# Patient Record
Sex: Male | Born: 1980 | Race: White | Hispanic: No | Marital: Married | Smoking: Current every day smoker
Health system: Southern US, Community
[De-identification: ages and names within clinical notes are randomized; demographics above are authoritative.]

## PROBLEM LIST (undated history)

## (undated) DIAGNOSIS — M928 Other specified juvenile osteochondrosis: Secondary | ICD-10-CM

## (undated) DIAGNOSIS — G8929 Other chronic pain: Secondary | ICD-10-CM

## (undated) DIAGNOSIS — M549 Dorsalgia, unspecified: Secondary | ICD-10-CM

## (undated) HISTORY — DX: Other chronic pain: G89.29

## (undated) HISTORY — DX: Dorsalgia, unspecified: M54.9

## (undated) HISTORY — DX: Other specified juvenile osteochondrosis: M92.8

---

## 2001-03-10 DIAGNOSIS — G8929 Other chronic pain: Secondary | ICD-10-CM

## 2001-03-10 HISTORY — DX: Other chronic pain: G89.29

## 2009-11-07 ENCOUNTER — Other Ambulatory Visit: Payer: Self-pay | Admitting: Internal Medicine

## 2009-12-07 ENCOUNTER — Other Ambulatory Visit: Payer: Self-pay | Admitting: Internal Medicine

## 2010-11-08 ENCOUNTER — Other Ambulatory Visit: Payer: Self-pay | Admitting: Internal Medicine

## 2010-11-08 DIAGNOSIS — M545 Low back pain: Secondary | ICD-10-CM

## 2010-11-08 NOTE — Telephone Encounter (Signed)
Patient needs a refill of Tramadol 50 mg 1 tablet by mouth 5 times a day,  sent to [facility]. He needs to pick up Rx today, he is going out of town for a week leaving 11/09/10

## 2010-11-09 MED ORDER — TRAMADOL HCL 50 MG PO TABS: 50.0000 mg | ORAL_TABLET | Freq: Every day | ORAL | Status: DC | PRN

## 2010-12-04 NOTE — Telephone Encounter (Signed)
Refill was complete 11/08/10

## 2010-12-30 ENCOUNTER — Other Ambulatory Visit: Payer: Self-pay | Admitting: Internal Medicine

## 2011-01-10 ENCOUNTER — Ambulatory Visit: Payer: Self-pay | Admitting: Internal Medicine

## 2011-01-10 DIAGNOSIS — Z0289 Encounter for other administrative examinations: Secondary | ICD-10-CM

## 2011-05-16 ENCOUNTER — Ambulatory Visit (INDEPENDENT_AMBULATORY_CARE_PROVIDER_SITE_OTHER): Payer: Self-pay | Admitting: Internal Medicine

## 2011-05-16 ENCOUNTER — Encounter: Payer: Self-pay | Admitting: Internal Medicine

## 2011-05-16 DIAGNOSIS — Z720 Tobacco use: Secondary | ICD-10-CM | POA: Insufficient documentation

## 2011-05-16 DIAGNOSIS — M549 Dorsalgia, unspecified: Secondary | ICD-10-CM | POA: Insufficient documentation

## 2011-05-16 DIAGNOSIS — F17200 Nicotine dependence, unspecified, uncomplicated: Secondary | ICD-10-CM

## 2011-05-16 DIAGNOSIS — J329 Chronic sinusitis, unspecified: Secondary | ICD-10-CM | POA: Insufficient documentation

## 2011-05-16 MED ORDER — METAXALONE 800 MG PO TABS: 800.0000 mg | ORAL_TABLET | Freq: Three times a day (TID) | ORAL | Status: DC

## 2011-05-16 MED ORDER — TRAMADOL HCL 50 MG PO TABS: 100.0000 mg | ORAL_TABLET | Freq: Four times a day (QID) | ORAL | Status: DC | PRN

## 2011-05-16 MED ORDER — VARENICLINE TARTRATE 0.5 MG PO TABS: 0.5000 mg | ORAL_TABLET | Freq: Two times a day (BID) | ORAL | Status: DC

## 2011-05-16 MED ORDER — AMOXICILLIN 500 MG PO CAPS: 500.0000 mg | ORAL_CAPSULE | Freq: Three times a day (TID) | ORAL | Status: AC

## 2011-05-16 NOTE — Patient Instructions (Signed)
For your sinus infection,  Take amoxicillin 3 times daily and sudafed PE (oral decongestant) 10 meg every 6 hours for congestion   Also consider using Simply Saline twice daily on each side  For the rest of the winter/spring to prevent more sinus infections.   Start your chantix one week before you quit smoking

## 2011-05-16 NOTE — Progress Notes (Signed)
Subjective:    Patient ID: Corey Shaw, male    DOB: Feb 14, 1981, 31 y.o.   MRN: [id number]  .  HPI  Mr. Corey Shaw is a 31 yr old white male with a history of chronic low back pain , managed with tramadol who presents for followup.  He was last seen by Dr Alison Murray.,  Back pain is tolerable during the day with tramadol but worse in the morning and evening . He is also having bilateral knee pain lately and has a history of Osgood Schlatter Syndrome.  His pain is worse when he takes the weight off after standing.  2nd issue is sinus infection,  Sinus pain and congestion for the past two weeks. 3rd issue is chronic tobacco abuse, up to one pack daily for the last 10 years.    , Past Medical History  Diagnosis Date  . Osgood-Schlatter/osteochondroses    No current outpatient prescriptions on file prior to visit.   Review of Systems  Constitutional: Negative for fever, chills, diaphoresis, activity change, appetite change, fatigue and unexpected weight change.  HENT: Negative for hearing loss, ear pain, nosebleeds, congestion, sore throat, facial swelling, rhinorrhea, sneezing, drooling, mouth sores, trouble swallowing, neck pain, neck stiffness, dental problem, voice change, postnasal drip, sinus pressure, tinnitus and ear discharge.   Eyes: Negative for photophobia, pain, discharge, redness, itching and visual disturbance.  Respiratory: Negative for apnea, cough, choking, chest tightness, shortness of breath, wheezing and stridor.   Cardiovascular: Negative for chest pain, palpitations and leg swelling.  Gastrointestinal: Negative for nausea, vomiting, abdominal pain, diarrhea, constipation, blood in stool, abdominal distention, anal bleeding and rectal pain.  Genitourinary: Negative for dysuria, urgency, frequency, hematuria, flank pain, decreased urine volume, scrotal swelling, difficulty urinating and testicular pain.  Musculoskeletal: Negative for myalgias, back pain, joint swelling,  arthralgias and gait problem.  Skin: Negative for color change, rash and wound.  Neurological: Negative for dizziness, tremors, seizures, syncope, speech difficulty, weakness, light-headedness, numbness and headaches.  Psychiatric/Behavioral: Negative for suicidal ideas, hallucinations, behavioral problems, confusion, sleep disturbance, dysphoric mood, decreased concentration and agitation. The patient is not nervous/anxious.   T      Objective:   Physical Exam General appearance: alert, cooperative and appears stated age Ears: normal TM's and external ear canals both ears Throat: lips, mucosa, and tongue normal; teeth and gums normal Neck: no adenopathy, no carotid bruit, supple, symmetrical, trachea midline and thyroid not enlarged, symmetric, no tenderness/mass/nodules Back: symmetric, no curvature. ROM normal. No CVA tenderness. Lungs: clear to auscultation bilaterally Heart: regular rate and rhythm, S1, S2 normal, no murmur, click, rub or gallop Abdomen: soft, non-tender; bowel sounds normal; no masses,  no organomegaly Pulses: 2+ and symmetric Skin: Skin color, texture, turgor normal. No rashes or lesions Lymph nodes: Cervical, supraclavicular, and axillary nodes normal.     Assessment & Plan:   Back pain Chronic, persistent, with some radiation but not below the knee. dtrs normal.  Managed with tramadol.  MRi  not done bc patient remains uninsured. Will increase his tramadol to 6 daily. Adding skelaxin  Sinusitis Given chronicity of symptoms, development of facial pain and exam consistent with bacterial URI,  Will treat with empiric antibiotics, decongestants, and saline lavage.  Adding steroid nasal spray if not already taking.   Tobacco abuse counselling given , He is interested in starting Chantix to help him quit smoking. Coupon given,  MOA of Chantix has been discussed .    Updated Medication List Outpatient Encounter Prescriptions as of 05/16/2011  Medication Sig  Dispense Refill  . meloxicam (MOBIC) 15 MG tablet Take 15 mg by mouth daily.      . traMADol (ULTRAM) 50 MG tablet Take 2 tablets (100 mg total) by mouth every 6 (six) hours as needed for pain.  180 tablet  6  . DISCONTD: cyclobenzaprine (FLEXERIL) 10 MG tablet Take 10 mg by mouth 3 (three) times daily as needed.      Marland Kitchen DISCONTD: traMADol (ULTRAM) 50 MG tablet TAKE ONE TABLET BY MOUTH 4 TIMES DAILY AS NEEDED FOR PAIN  120 tablet  2  . amoxicillin (AMOXIL) 500 MG capsule Take 1 capsule (500 mg total) by mouth 3 (three) times daily.  21 capsule  0  . metaxalone (SKELAXIN) 800 MG tablet Take 1 tablet (800 mg total) by mouth 3 (three) times daily.  90 tablet  6  . varenicline (CHANTIX) 0.5 MG tablet Take 1 tablet (0.5 mg total) by mouth 2 (two) times daily.  60 tablet  0

## 2011-05-18 ENCOUNTER — Encounter: Payer: Self-pay | Admitting: Internal Medicine

## 2011-05-18 DIAGNOSIS — M928 Other specified juvenile osteochondrosis: Secondary | ICD-10-CM | POA: Insufficient documentation

## 2011-05-18 NOTE — Assessment & Plan Note (Addendum)
Chronic, persistent, with some radiation but not below the knee. dtrs normal.  Managed with tramadol.  MRi  not done bc patient remains uninsured. Will increase his tramadol to 6 daily. Adding skelaxin

## 2011-05-18 NOTE — Assessment & Plan Note (Signed)
Given chronicity of symptoms, development of facial pain and exam consistent with bacterial URI,  Will treat with empiric antibiotics, decongestants, and saline lavage.  Adding steroid nasal spray if not already taking.  °

## 2011-05-18 NOTE — Assessment & Plan Note (Signed)
counselling given , He is interested in starting Chantix to help him quit smoking. Coupon given,  MOA of Chantix has been discussed .

## 2011-07-25 ENCOUNTER — Other Ambulatory Visit: Payer: Self-pay | Admitting: Internal Medicine

## 2011-07-25 ENCOUNTER — Ambulatory Visit (INDEPENDENT_AMBULATORY_CARE_PROVIDER_SITE_OTHER)
Admission: RE | Admit: 2011-07-25 | Discharge: 2011-07-25 | Disposition: A | Discharge status: Home / Self Care | Payer: Self-pay | Source: Ambulatory Visit | Attending: Internal Medicine | Admitting: Internal Medicine

## 2011-07-25 ENCOUNTER — Telehealth: Payer: Self-pay | Admitting: Internal Medicine

## 2011-07-25 DIAGNOSIS — M7989 Other specified soft tissue disorders: Secondary | ICD-10-CM

## 2011-07-25 MED ORDER — HYDROCODONE-ACETAMINOPHEN 5-500 MG PO TABS: 1.0000 | ORAL_TABLET | Freq: Four times a day (QID) | ORAL | Status: DC | PRN

## 2011-07-25 NOTE — Telephone Encounter (Signed)
Caller: Neilson/Patient; PCP: Duncan Dull; CB#: [phone]; ; ; Call regarding Injury/Trauma; smashed finger between two stepping stones; injured R ring finger.  Occurred 07/24/11 1230.  Pain seemed to be minimal, but increased over course of PM 07/24/11.  On arising AM 07/25/11 notes that the finger is now very swollen from the proximal knuckle to the tip of the finger.  States it is extremely painful to touch or bend the finger.  Per protocol, advised appt within 4 hours; no appts available in system.  TC to office per office protocol; RN transferred to office clinical staff voicemail; message left regarding patient for call back.

## 2011-07-25 NOTE — Telephone Encounter (Signed)
I spoke with patient he will go to [facility] this afternoon for the x ray.

## 2011-07-25 NOTE — Telephone Encounter (Signed)
Patient needs an x ray.  If he is willing to go to [facility] this afternoon for plain films of right ring finger,  I will put in EPIC.  He needs to go soon so in case there is a fracture i can contact an orthopedists office to see if they can splint him

## 2011-07-30 ENCOUNTER — Encounter: Payer: Self-pay | Admitting: Internal Medicine

## 2011-07-30 ENCOUNTER — Ambulatory Visit (INDEPENDENT_AMBULATORY_CARE_PROVIDER_SITE_OTHER): Payer: Self-pay | Admitting: Internal Medicine

## 2011-07-30 VITALS — BP 130/68 | HR 76 | Temp 98.4°F | Resp 14 | Wt 120.5 lb

## 2011-07-30 DIAGNOSIS — F17200 Nicotine dependence, unspecified, uncomplicated: Secondary | ICD-10-CM

## 2011-07-30 DIAGNOSIS — Z720 Tobacco use: Secondary | ICD-10-CM

## 2011-07-30 DIAGNOSIS — S62609A Fracture of unspecified phalanx of unspecified finger, initial encounter for closed fracture: Secondary | ICD-10-CM

## 2011-07-30 DIAGNOSIS — G8929 Other chronic pain: Secondary | ICD-10-CM

## 2011-07-30 DIAGNOSIS — E119 Type 2 diabetes mellitus without complications: Secondary | ICD-10-CM

## 2011-07-30 DIAGNOSIS — M549 Dorsalgia, unspecified: Secondary | ICD-10-CM

## 2011-07-30 MED ORDER — CYCLOBENZAPRINE HCL 10 MG PO TABS: 10.0000 mg | ORAL_TABLET | Freq: Three times a day (TID) | ORAL | Status: DC | PRN
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
## 2011-08-03 ENCOUNTER — Encounter: Payer: Self-pay | Admitting: Internal Medicine

## 2011-08-03 DIAGNOSIS — R7301 Impaired fasting glucose: Secondary | ICD-10-CM | POA: Insufficient documentation

## 2011-08-03 DIAGNOSIS — S62609A Fracture of unspecified phalanx of unspecified finger, initial encounter for closed fracture: Secondary | ICD-10-CM | POA: Insufficient documentation

## 2011-08-03 DIAGNOSIS — M5431 Sciatica, right side: Secondary | ICD-10-CM | POA: Insufficient documentation

## 2011-08-03 NOTE — Assessment & Plan Note (Signed)
Prior trial of Chantix was not tolerated due to abdominal pain..  He is not currently motivated to quit.    

## 2011-08-03 NOTE — Assessment & Plan Note (Signed)
By hgba1c in 2011 of 6.2, with no follow up labs since then.  Will have him return in 6 weeks for recheck on his finger, with labs prior to appt.

## 2011-08-03 NOTE — Assessment & Plan Note (Signed)
Secondary to crush injury while moving cement pavers.  He has been wearing a finger splint since the accident and will need to wear it for a minimum of 4 to 6 weeks,  Ice , NSAID, tramadol/vicodin.
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
## 2011-08-13 ENCOUNTER — Encounter: Payer: Self-pay | Admitting: Internal Medicine

## 2011-08-21 ENCOUNTER — Other Ambulatory Visit: Payer: Self-pay | Admitting: Internal Medicine

## 2011-08-22 MED ORDER — HYDROCODONE-ACETAMINOPHEN 5-500 MG PO TABS: 1.0000 | ORAL_TABLET | Freq: Four times a day (QID) | ORAL | Status: AC | PRN

## 2011-08-22 NOTE — Progress Notes (Signed)
One refillonly,  Because this was for his broken finger

## 2011-11-17 ENCOUNTER — Other Ambulatory Visit: Payer: Self-pay | Admitting: Internal Medicine

## 2011-11-17 ENCOUNTER — Ambulatory Visit: Payer: Self-pay | Admitting: Internal Medicine

## 2011-11-17 MED ORDER — CYCLOBENZAPRINE HCL 10 MG PO TABS: 10.0000 mg | ORAL_TABLET | Freq: Three times a day (TID) | ORAL | Status: AC | PRN

## 2011-11-17 MED ORDER — TRAMADOL HCL 50 MG PO TABS: 100.0000 mg | ORAL_TABLET | Freq: Four times a day (QID) | ORAL | Status: DC | PRN

## 2011-11-17 NOTE — Telephone Encounter (Signed)
Tramadol and flexirel, he is running out today on these two medications. He is rescheduled to Thursday of this week.  He uses [facility] on [address].

## 2011-11-20 ENCOUNTER — Ambulatory Visit: Payer: Self-pay | Admitting: Internal Medicine

## 2011-12-02 ENCOUNTER — Ambulatory Visit (INDEPENDENT_AMBULATORY_CARE_PROVIDER_SITE_OTHER): Payer: Self-pay | Admitting: Internal Medicine

## 2011-12-02 ENCOUNTER — Encounter: Payer: Self-pay | Admitting: Internal Medicine

## 2011-12-02 VITALS — BP 130/70 | HR 97 | Temp 98.1°F | Resp 16 | Wt 123.2 lb

## 2011-12-02 DIAGNOSIS — G4700 Insomnia, unspecified: Secondary | ICD-10-CM | POA: Insufficient documentation

## 2011-12-02 DIAGNOSIS — E119 Type 2 diabetes mellitus without complications: Secondary | ICD-10-CM

## 2011-12-02 DIAGNOSIS — S62609A Fracture of unspecified phalanx of unspecified finger, initial encounter for closed fracture: Secondary | ICD-10-CM

## 2011-12-02 NOTE — Assessment & Plan Note (Signed)
Needs fasting glucose and hgba1c.  6.2 in 2011.  No follow up since then

## 2011-12-02 NOTE — Assessment & Plan Note (Addendum)
Secondary to loss of grandfather suddenly due to [facility] MI one week ago.,  Samples of Lunesta 2 mg and 3 mg given

## 2011-12-02 NOTE — Assessment & Plan Note (Signed)
Healed,  Still has sensitivity to pressure, but all swelling and discoloration is gone

## 2011-12-02 NOTE — Progress Notes (Signed)
Patient ID: Corey Shaw, male   DOB: 01-Mar-1981, 31 y.o.   MRN: [id number] Patient Active Problem List  Diagnosis  . Tobacco abuse  . Back pain  . Sinusitis  . Osgood-Schlatter/osteochondroses  . Chronic back pain greater than 3 months duration  . Fx phalanges, hand-closed  . Diabetes mellitus type 2, diet-controlled  . Insomnia    Subjective:  CC:   Chief Complaint  Patient presents with  . Follow-up    6 month    HPI:   Corey Shaw is a 31 y.o. male who presents for follow up on chronic conditions, including  increased anxiety lately.  His grandfather died a week ago suddenly of a massive MI at the age of  30 yrs old after complaining of chest pain for 3 days.  He has been having insomnia. With trouble falling asleep every night.  His chronic back pain is controlled with tramadol.  His right third finger distal nondisplaced fracture swelling has resolved, but the tip is still sensitive to pressure.    Past Medical History  Diagnosis Date  . Osgood-Schlatter/osteochondroses   . Chronic back pain greater than 3 months duration 2003    post injury. last MRI 2008: mild disk bulge L5-S1 no stenosis  . Diabetes mellitus     History reviewed. No pertinent past surgical history.   The following portions of the patient's history were reviewed and updated as appropriate: Allergies, current medications, and problem list.    Review of Systems:   12 Pt  review of systems was negative except those addressed in the HPI.    History   Social History  . Marital Status: Married    Spouse Name: N/A    Number of Children: N/A  . Years of Education: N/A   Occupational History  . Not on file.   Social History Main Topics  . Smoking status: Current Every Day Smoker    Types: Cigarettes  . Smokeless tobacco: Never Used  . Alcohol Use: No  . Drug Use: Not on file  . Sexually Active: Not on file   Other Topics Concern  . Not on file   Social History Narrative  . No  narrative on file    Objective:  BP 130/70  Pulse 97  Temp 98.1 F (36.7 C) (Oral)  Resp 16  Wt 123 lb 4 oz (55.906 kg)  SpO2 99%  General appearance: alert, cooperative and appears stated age Back: symmetric, no curvature. ROM normal. No CVA tenderness. Lungs: clear to auscultation bilaterally Heart: regular rate and rhythm, S1, S2 normal, no murmur, click, rub or gallop Abdomen: soft, non-tender; bowel sounds normal; no masses,  no organomegaly Pulses: 2+ and symmetric Skin: Skin color, texture, turgor normal. No rashes or lesions Lymph nodes: Cervical, supraclavicular, and axillary nodes normal.  Assessment and Plan:  Diabetes mellitus type 2, diet-controlled Needs fasting glucose and hgba1c.  6.2 in 2011.  No follow up since then   Fx phalanges, hand-closed Healed,  Still has sensitivity to pressure, but all swelling and discoloration is gone   Insomnia Secondary to loss of grandfather suddenly due to [facility] MI one week ago.,  Samples of Lunesta 2 mg and 3 mg given    Updated Medication List Outpatient Encounter Prescriptions as of 12/02/2011  Medication Sig Dispense Refill  . cyclobenzaprine (FLEXERIL) 10 MG tablet Take 1 tablet (10 mg total) by mouth 3 (three) times daily as needed for muscle spasms.  90 tablet  3  .  traMADol (ULTRAM) 50 MG tablet Take 2 tablets (100 mg total) by mouth every 6 (six) hours as needed for pain.  180 tablet  6     No orders of the defined types were placed in this encounter.    No Follow-up on file.

## 2011-12-05 ENCOUNTER — Telehealth: Payer: Self-pay | Admitting: Internal Medicine

## 2011-12-05 DIAGNOSIS — G4700 Insomnia, unspecified: Secondary | ICD-10-CM

## 2011-12-05 NOTE — Telephone Encounter (Signed)
Patient called and stated you gave him samples of a sleep med but told him to call back if he thought it was more anxiety related.  He stated he thinks he does have anxiety and wanted to know if you could prescribe an Rx for that.  Please advise.

## 2011-12-08 MED ORDER — ALPRAZOLAM 0.5 MG PO TABS: 0.5000 mg | ORAL_TABLET | Freq: Every evening | ORAL | Status: DC | PRN

## 2011-12-08 NOTE — Telephone Encounter (Signed)
Yes , we did discuss a trial of  Alprazolam 0.5 mg one tablet daily at bedtime as needed for sleep or anxiety  #30 no refill.

## 2011-12-09 NOTE — Telephone Encounter (Signed)
Pt notified and RX faxed to pharmacy.

## 2012-01-08 ENCOUNTER — Other Ambulatory Visit: Payer: Self-pay | Admitting: Internal Medicine

## 2012-01-08 NOTE — Telephone Encounter (Signed)
#  30 with one refill

## 2012-01-09 ENCOUNTER — Other Ambulatory Visit: Payer: Self-pay

## 2012-01-09 NOTE — Telephone Encounter (Signed)
Rx Xanax 0.5 mg #30 1 R faxed to [facility] [phone]

## 2012-05-03 ENCOUNTER — Other Ambulatory Visit: Payer: Self-pay | Admitting: Internal Medicine

## 2012-05-04 NOTE — Telephone Encounter (Signed)
Ok to refill,  Authorized in epic 

## 2012-05-05 NOTE — Telephone Encounter (Signed)
Rx called to pharmacy

## 2012-07-12 ENCOUNTER — Other Ambulatory Visit: Payer: Self-pay | Admitting: Internal Medicine

## 2012-07-12 NOTE — Telephone Encounter (Signed)
30 days only,  Needs appt prior to more refills and labs (CMET A1c and fasting lipids

## 2012-07-13 NOTE — Telephone Encounter (Signed)
Pt notified of need of appointment and labs prior to next refill. Declined to schedule at this time, will call back.

## 2012-08-24 ENCOUNTER — Ambulatory Visit (INDEPENDENT_AMBULATORY_CARE_PROVIDER_SITE_OTHER): Payer: Self-pay | Admitting: Internal Medicine

## 2012-08-24 ENCOUNTER — Encounter: Payer: Self-pay | Admitting: Internal Medicine

## 2012-08-24 VITALS — BP 126/74 | HR 111 | Temp 98.3°F | Resp 16 | Wt 121.5 lb

## 2012-08-24 DIAGNOSIS — F411 Generalized anxiety disorder: Secondary | ICD-10-CM | POA: Insufficient documentation

## 2012-08-24 DIAGNOSIS — G8929 Other chronic pain: Secondary | ICD-10-CM

## 2012-08-24 DIAGNOSIS — M549 Dorsalgia, unspecified: Secondary | ICD-10-CM

## 2012-08-24 MED ORDER — TRAMADOL HCL 50 MG PO TABS: ORAL_TABLET | ORAL | Status: DC

## 2012-08-24 MED ORDER — SERTRALINE HCL 50 MG PO TABS: 50.0000 mg | ORAL_TABLET | Freq: Every day | ORAL | Status: DC

## 2012-08-24 MED ORDER — ALPRAZOLAM 1 MG PO TABS: ORAL_TABLET | ORAL | Status: DC

## 2012-08-24 MED ORDER — HYDROCODONE-ACETAMINOPHEN 5-325 MG PO TABS: 1.0000 | ORAL_TABLET | Freq: Every evening | ORAL | Status: DC | PRN

## 2012-08-24 NOTE — Patient Instructions (Addendum)
I have increased your alprazolam to 1 mg twice daily AS NEEDED and recommend that you start sertraline (generic zoloft) on a daily basis to help you mange the ups and downs of yoru currently turbulent home life  vicodin for nighttime pain only..  You must get  Your labs done prior to refills on the vicodin
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
## 2012-08-24 NOTE — Assessment & Plan Note (Addendum)
Sertraline and alprazolam recommended.

## 2012-08-25 ENCOUNTER — Encounter: Payer: Self-pay | Admitting: Internal Medicine

## 2012-08-25 NOTE — Assessment & Plan Note (Signed)
Continue tramadol for daytime pain and adding one vicodin nightly to help him rest.  UDS and labs ordered.

## 2012-11-22 ENCOUNTER — Telehealth: Payer: Self-pay | Admitting: Internal Medicine

## 2012-11-22 MED ORDER — TRAMADOL HCL 50 MG PO TABS: ORAL_TABLET | ORAL | Status: DC

## 2012-11-22 NOTE — Telephone Encounter (Signed)
Since new law pharmacy will not refill without new script, okay to fill? Do you want patient to see Chanel before filling?

## 2012-11-22 NOTE — Telephone Encounter (Signed)
Notified patient script sent, patient has an appointment for 08/2013 because of insurance purposes he ask if that would be soon enough ( has no insurance) please advise.

## 2012-11-22 NOTE — Telephone Encounter (Signed)
Can refill,   Printed ,  Needs appt soon

## 2012-11-22 NOTE — Telephone Encounter (Signed)
Hell have to have the urine test like everyone else prior to any more refills,  But he does not need to see me unless he is requesting a dose change.

## 2012-11-22 NOTE — Telephone Encounter (Signed)
The patient stated that he has 3 refills ,but the pharmacy will not refill his prescription.  traMADol (ULTRAM) 50 MG tablet

## 2012-11-25 NOTE — Telephone Encounter (Signed)
Patient notified needs to come in for UDS before further refills.

## 2013-01-04 ENCOUNTER — Telehealth: Payer: Self-pay | Admitting: Internal Medicine

## 2013-01-04 NOTE — Telephone Encounter (Signed)
Tramadol and Norco refills needed.  States he still has xanax but would like to go ahead and get this one too since he is having to go through picking up scripts.

## 2013-01-04 NOTE — Telephone Encounter (Signed)
Pt states he now has insurance and would like to talk with Dr. Darrick Huntsman about any surgery or anything that can be done to help him.  Pt is making appt to discuss.  States he needs medication asap as he is out and is having a lot of pain.

## 2013-01-05 ENCOUNTER — Encounter: Payer: Self-pay | Admitting: *Deleted

## 2013-01-05 MED ORDER — TRAMADOL HCL 50 MG PO TABS: ORAL_TABLET | ORAL | Status: DC

## 2013-01-05 MED ORDER — ALPRAZOLAM 1 MG PO TABS: ORAL_TABLET | ORAL | Status: DC

## 2013-01-05 MED ORDER — HYDROCODONE-ACETAMINOPHEN 5-325 MG PO TABS: 1.0000 | ORAL_TABLET | Freq: Every evening | ORAL | Status: DC | PRN

## 2013-01-05 NOTE — Telephone Encounter (Signed)
Patient notified script ready for pick up. Needs UDS 

## 2013-01-05 NOTE — Telephone Encounter (Signed)
Ok to refill,  Authorized in epic 

## 2013-01-12 ENCOUNTER — Ambulatory Visit (INDEPENDENT_AMBULATORY_CARE_PROVIDER_SITE_OTHER): Payer: Medicaid Other | Admitting: Internal Medicine

## 2013-01-12 ENCOUNTER — Encounter (INDEPENDENT_AMBULATORY_CARE_PROVIDER_SITE_OTHER): Payer: Self-pay

## 2013-01-12 ENCOUNTER — Encounter: Payer: Self-pay | Admitting: Internal Medicine

## 2013-01-12 VITALS — BP 110/66 | HR 94 | Temp 98.0°F | Resp 12 | Ht 68.0 in | Wt 124.5 lb

## 2013-01-12 DIAGNOSIS — Z13220 Encounter for screening for lipoid disorders: Secondary | ICD-10-CM

## 2013-01-12 DIAGNOSIS — E559 Vitamin D deficiency, unspecified: Secondary | ICD-10-CM

## 2013-01-12 DIAGNOSIS — F411 Generalized anxiety disorder: Secondary | ICD-10-CM

## 2013-01-12 DIAGNOSIS — M25569 Pain in unspecified knee: Secondary | ICD-10-CM

## 2013-01-12 DIAGNOSIS — G8929 Other chronic pain: Secondary | ICD-10-CM

## 2013-01-12 DIAGNOSIS — M25562 Pain in left knee: Secondary | ICD-10-CM

## 2013-01-12 DIAGNOSIS — IMO0002 Reserved for concepts with insufficient information to code with codable children: Secondary | ICD-10-CM

## 2013-01-12 DIAGNOSIS — R5381 Other malaise: Secondary | ICD-10-CM

## 2013-01-12 DIAGNOSIS — M549 Dorsalgia, unspecified: Secondary | ICD-10-CM

## 2013-01-12 DIAGNOSIS — E119 Type 2 diabetes mellitus without complications: Secondary | ICD-10-CM

## 2013-01-12 MED ORDER — PREGABALIN 75 MG PO CAPS: 75.0000 mg | ORAL_CAPSULE | Freq: Two times a day (BID) | ORAL | Status: DC

## 2013-01-12 MED ORDER — SERTRALINE HCL 100 MG PO TABS: 100.0000 mg | ORAL_TABLET | Freq: Every day | ORAL | Status: DC

## 2013-01-12 MED ORDER — ALPRAZOLAM 1 MG PO TABS: ORAL_TABLET | ORAL | Status: DC

## 2013-01-12 NOTE — Patient Instructions (Signed)
Resume zoloft for anxiety at 1/2 tablet daily for the first week,  Then full tablet.  Lyrica 75 mg up to 3 times daily  We will try Chantix once we get your anxiety under control

## 2013-01-12 NOTE — Progress Notes (Signed)
Pre-visit discussion using our clinic review tool. No additional management support is needed unless otherwise documented below in the visit note.  

## 2013-01-12 NOTE — Progress Notes (Signed)
Patient ID: Corey Shaw, male   DOB: December 29, 1980, 32 y.o.   MRN: [id number]  Patient Active Problem List   Diagnosis Date Noted  . Anxiety state, unspecified 08/24/2012  . Insomnia 12/02/2011  . Fx phalanges, hand-closed 08/03/2011  . Chronic back pain greater than 3 months duration   . Diabetes mellitus type 2, diet-controlled   . Osgood-Schlatter/osteochondroses   . Tobacco abuse 05/16/2011    Subjective:  CC:   Chief Complaint  Patient presents with  . Back Pain  . Leg Pain    bilateral leg pain    HPI:   Corey Shaw a 32 y.o. male who presents for follow upon chronic issues.   Marital discord has now resolved.  Wife is now going to school to finish her Bachelors Degree  Having oral surgery next week.,   extractions   Eye exam also planned  Wants to get his lumbar spine MRI'd bc he has been unable to afford this in the past while insured.   He recently had an episode of left leg numbness and weakness while descending a ladder ,  [place] Seat to the ground. He has 3 kids to support and is worried about his back.  His back pain radiates down both legs.  Both legs and knees ache. Used to take lyrica   Does not want flu shot     Past Medical History  Diagnosis Date  . Osgood-Schlatter/osteochondroses   . Chronic back pain greater than 3 months duration 2003    post injury. last MRI 2008: mild disk bulge L5-S1 no stenosis  . Diabetes mellitus     History reviewed. No pertinent past surgical history.     The following portions of the patient's history were reviewed and updated as appropriate: Allergies, current medications, and problem list.    Review of Systems:   12 Pt  review of systems was negative except those addressed in the HPI,     History   Social History  . Marital Status: Married    Spouse Name: N/A    Number of Children: N/A  . Years of Education: N/A   Occupational History  . Not on file.   Social History Main Topics  . Smoking  status: Current Every Day Smoker    Types: Cigarettes  . Smokeless tobacco: Never Used  . Alcohol Use: No  . Drug Use: Not on file  . Sexual Activity: Not on file   Other Topics Concern  . Not on file   Social History Narrative  . No narrative on file    Objective:  Filed Vitals:   01/12/13 1014  BP: 110/66  Pulse: 94  Temp: 98 F (36.7 C)  Resp: 12     General appearance: alert, cooperative and appears stated age Ears: normal TM's and external ear canals both ears Throat: lips, mucosa, and tongue normal; teeth and gums normal Neck: no adenopathy, no carotid bruit, supple, symmetrical, trachea midline and thyroid not enlarged, symmetric, no tenderness/mass/nodules Back: symmetric, no curvature. ROM normal. No CVA tenderness. Lungs: clear to auscultation bilaterally Heart: regular rate and rhythm, S1, S2 normal, no murmur, click, rub or gallop Abdomen: soft, non-tender; bowel sounds normal; no masses,  no organomegaly Pulses: 2+ and symmetric Skin: Skin color, texture, turgor normal. No rashes or lesions Lymph nodes: Cervical, supraclavicular, and axillary nodes normal. Foot exam:  Nails are well trimmed,  No callouses,  Sensation intact to microfilament   Assessment and Plan:  Chronic back pain greater  than 3 months duration With symptoms suggestive of spinal stenosis .  MRI of lumbar spine ordered  Diabetes mellitus type 2, diet-controlled Historically Well-controlled on diet alone .  hemoglobin A1c has been consistently less than 7.0 . Patient is soon to be up-to-date on eye exams and foot exam was done today.  .  Fasting lipids have been ordered.   Updated Medication List Outpatient Encounter Prescriptions as of 01/12/2013  Medication Sig  . ALPRAZolam (XANAX) 1 MG tablet TAKE ONE TABLET BY MOUTH AT BEDTIME AS NEEDED FOR SLEEP OR ANXIETY  . HYDROcodone-acetaminophen (NORCO/VICODIN) 5-325 MG per tablet Take 1 tablet by mouth at bedtime as needed for pain.  .  traMADol (ULTRAM) 50 MG tablet TAKE TWO TABLETS BY MOUTH EVERY 6 HOURS AS NEEDED FOR PAIN  . [DISCONTINUED] ALPRAZolam (XANAX) 1 MG tablet TAKE ONE TABLET BY MOUTH AT BEDTIME AS NEEDED FOR SLEEP OR ANXIETY  . pregabalin (LYRICA) 75 MG capsule Take 1 capsule (75 mg total) by mouth 2 (two) times daily.  . sertraline (ZOLOFT) 100 MG tablet Take 1 tablet (100 mg total) by mouth daily.  . [DISCONTINUED] sertraline (ZOLOFT) 50 MG tablet Take 1 tablet (50 mg total) by mouth daily.     Orders Placed This Encounter  Procedures  . MR Lumbar Spine Wo Contrast  . DG Knee Complete 4 Views Left  . Comprehensive metabolic panel  . TSH  . Lipid panel  . Vit D  25 hydroxy (rtn osteoporosis monitoring)  . CBC with Differential  . Hemoglobin A1c    No Follow-up on file.

## 2013-01-13 ENCOUNTER — Encounter: Payer: Self-pay | Admitting: Internal Medicine

## 2013-01-13 NOTE — Assessment & Plan Note (Signed)
With symptoms suggestive of spinal stenosis .  MRI of lumbar spine ordered

## 2013-01-13 NOTE — Assessment & Plan Note (Signed)
Historically Well-controlled on diet alone .  hemoglobin A1c has been consistently less than 7.0 . Patient is soon to be up-to-date on eye exams and foot exam was done today.  .  Fasting lipids have been ordered.   

## 2013-01-14 ENCOUNTER — Other Ambulatory Visit: Payer: Medicaid Other

## 2013-01-17 ENCOUNTER — Ambulatory Visit (HOSPITAL_COMMUNITY)
Admission: RE | Admit: 2013-01-17 | Discharge: 2013-01-17 | Disposition: A | Discharge status: Home / Self Care | Payer: Medicaid Other | Source: Ambulatory Visit | Attending: Internal Medicine | Admitting: Internal Medicine

## 2013-01-17 DIAGNOSIS — M25569 Pain in unspecified knee: Secondary | ICD-10-CM | POA: Insufficient documentation

## 2013-01-17 DIAGNOSIS — M25562 Pain in left knee: Secondary | ICD-10-CM

## 2013-01-17 DIAGNOSIS — M5146 Schmorl's nodes, lumbar region: Secondary | ICD-10-CM | POA: Insufficient documentation

## 2013-01-17 DIAGNOSIS — IMO0002 Reserved for concepts with insufficient information to code with codable children: Secondary | ICD-10-CM

## 2013-01-17 DIAGNOSIS — M545 Low back pain, unspecified: Secondary | ICD-10-CM | POA: Insufficient documentation

## 2013-01-17 DIAGNOSIS — M5126 Other intervertebral disc displacement, lumbar region: Secondary | ICD-10-CM | POA: Insufficient documentation

## 2013-01-18 ENCOUNTER — Encounter: Payer: Self-pay | Admitting: Internal Medicine

## 2013-01-27 ENCOUNTER — Encounter: Payer: Self-pay | Admitting: Internal Medicine

## 2013-03-07 ENCOUNTER — Telehealth: Payer: Self-pay | Admitting: Internal Medicine

## 2013-03-07 NOTE — Telephone Encounter (Signed)
Last visit 01/12/13, ok refill?

## 2013-03-07 NOTE — Telephone Encounter (Signed)
traMADol (ULTRAM) 50 MG tablet ° °

## 2013-03-08 MED ORDER — TRAMADOL HCL 50 MG PO TABS: ORAL_TABLET | ORAL | Status: DC

## 2013-03-08 NOTE — Telephone Encounter (Signed)
Ok to refill,  Authorized in epic 

## 2013-03-09 ENCOUNTER — Other Ambulatory Visit: Payer: Self-pay | Admitting: *Deleted

## 2013-03-09 NOTE — Telephone Encounter (Signed)
Rx faxed to pharmacy  

## 2013-03-09 NOTE — Telephone Encounter (Signed)
Okay to refill? 

## 2013-03-11 ENCOUNTER — Other Ambulatory Visit: Payer: Self-pay | Admitting: *Deleted

## 2013-03-11 NOTE — Telephone Encounter (Signed)
Tramadol was refilled on Dec 30th.  Can you check the box for the rx?

## 2013-03-14 NOTE — Telephone Encounter (Signed)
I faxed Rx 03/11/13

## 2013-03-22 ENCOUNTER — Other Ambulatory Visit: Payer: Self-pay | Admitting: *Deleted

## 2013-03-30 ENCOUNTER — Other Ambulatory Visit: Payer: Self-pay | Admitting: *Deleted

## 2013-05-03 ENCOUNTER — Telehealth: Payer: Self-pay | Admitting: *Deleted

## 2013-05-03 ENCOUNTER — Encounter: Payer: Self-pay | Admitting: Adult Health

## 2013-05-03 ENCOUNTER — Ambulatory Visit (INDEPENDENT_AMBULATORY_CARE_PROVIDER_SITE_OTHER): Payer: Medicaid Other | Admitting: Adult Health

## 2013-05-03 VITALS — BP 112/66 | HR 82 | Temp 97.7°F | Resp 14 | Wt 120.0 lb

## 2013-05-03 DIAGNOSIS — M549 Dorsalgia, unspecified: Secondary | ICD-10-CM

## 2013-05-03 MED ORDER — HYDROCODONE-ACETAMINOPHEN 5-325 MG PO TABS: 1.0000 | ORAL_TABLET | Freq: Every evening | ORAL | Status: DC | PRN

## 2013-05-03 MED ORDER — TRAMADOL HCL 50 MG PO TABS: ORAL_TABLET | ORAL | Status: DC

## 2013-05-03 MED ORDER — METAXALONE 800 MG PO TABS: 800.0000 mg | ORAL_TABLET | Freq: Three times a day (TID) | ORAL | Status: DC

## 2013-05-03 NOTE — Progress Notes (Signed)
Pre visit review using our clinic review tool, if applicable. No additional management support is needed unless otherwise documented below in the visit note. 

## 2013-05-03 NOTE — Telephone Encounter (Signed)
Called pharmacy to fax over prior authorization information

## 2013-05-03 NOTE — Progress Notes (Signed)
Patient ID: Corey Shaw, male   DOB: 23-Jul-1980, 33 y.o.   MRN: [id number]    Subjective:    Patient ID: Corey CardBrian B Shaw, male    DOB: 23-Jul-1980, 33 y.o.   MRN: [id number]  HPI  Pt is a 33 y/o male who presents to clinic after falling on ice last week. Hx of chronic back pain now worsened.  He has been taking tramadol but reports this is not helping. He has been on tramadol for several years and this has been helping; however, with his fall he finds the medication is not helping.    Past Medical History  Diagnosis Date  . Osgood-Schlatter/osteochondroses   . Chronic back pain greater than 3 months duration 2003    post injury. last MRI 2008: mild disk bulge L5-S1 no stenosis  . Diabetes mellitus     Current Outpatient Prescriptions on File Prior to Visit  Medication Sig Dispense Refill  . ALPRAZolam (XANAX) 1 MG tablet TAKE ONE TABLET BY MOUTH AT BEDTIME AS NEEDED FOR SLEEP OR ANXIETY  30 tablet  3  . HYDROcodone-acetaminophen (NORCO/VICODIN) 5-325 MG per tablet Take 1 tablet by mouth at bedtime as needed for pain.  30 tablet  0  . pregabalin (LYRICA) 75 MG capsule Take 1 capsule (75 mg total) by mouth 2 (two) times daily.  90 capsule  3  . sertraline (ZOLOFT) 100 MG tablet Take 1 tablet (100 mg total) by mouth daily.  30 tablet  3  . traMADol (ULTRAM) 50 MG tablet TAKE TWO TABLETS BY MOUTH EVERY 6 HOURS AS NEEDED FOR PAIN  180 tablet  1   No current facility-administered medications on file prior to visit.     Review of Systems  Respiratory: Negative.   Cardiovascular: Negative.   Gastrointestinal: Negative.   Genitourinary: Negative for difficulty urinating.  Musculoskeletal: Positive for arthralgias and back pain (chronic back pain injured following fall 1 week ago). Negative for gait problem.  Psychiatric/Behavioral: Negative.   All other systems reviewed and are negative.       Objective:  Pulse 82  Resp 14  Wt 120 lb (54.432 kg)  SpO2 95%   Physical Exam    Constitutional: He is oriented to person, place, and time.  Appears uncomfortable  Cardiovascular: Normal rate and regular rhythm.   Pulmonary/Chest: Effort normal. No respiratory distress.  Musculoskeletal: He exhibits tenderness. He exhibits no edema.  Point tenderness to low back area  Neurological: He is alert and oriented to person, place, and time.  Skin: Skin is warm and dry.  Psychiatric: He has a normal mood and affect. His behavior is normal. Judgment and thought content normal.       Assessment & Plan:   1. Back pain Acute on chronic back pain following fall 1 week ago when he slipped on ice. Refills for tramadol and vicodin provided. Samples of lyrica provided. States that insurance has not let him fill unless has prior authorization. We have not received any. He will contact his insurance. Reports improvement when he was taking lyrica. Also, skelaxin prescription given. RTC in 1 month for f/u with PCP.

## 2013-05-04 ENCOUNTER — Telehealth: Payer: Self-pay | Admitting: Internal Medicine

## 2013-05-04 ENCOUNTER — Other Ambulatory Visit: Payer: Self-pay | Admitting: Adult Health

## 2013-05-04 MED ORDER — METHOCARBAMOL 750 MG PO TABS: 750.0000 mg | ORAL_TABLET | Freq: Three times a day (TID) | ORAL | Status: DC | PRN

## 2013-05-04 NOTE — Telephone Encounter (Signed)
Relevant patient education assigned to patient using Emmi. ° °

## 2013-05-04 NOTE — Telephone Encounter (Signed)
Received PA request form for the lyrica placed in Dr.tullo box

## 2013-05-06 DIAGNOSIS — Z0279 Encounter for issue of other medical certificate: Secondary | ICD-10-CM

## 2013-05-24 ENCOUNTER — Other Ambulatory Visit: Payer: Self-pay | Admitting: Adult Health

## 2013-05-24 NOTE — Telephone Encounter (Signed)
Ok refill? 

## 2013-06-01 ENCOUNTER — Ambulatory Visit: Payer: Medicaid Other | Admitting: Internal Medicine

## 2013-06-01 ENCOUNTER — Encounter: Payer: Self-pay | Admitting: Internal Medicine

## 2013-06-01 ENCOUNTER — Ambulatory Visit (INDEPENDENT_AMBULATORY_CARE_PROVIDER_SITE_OTHER): Payer: Medicaid Other | Admitting: Internal Medicine

## 2013-06-01 ENCOUNTER — Telehealth: Payer: Self-pay | Admitting: Internal Medicine

## 2013-06-01 VITALS — BP 112/68 | HR 78 | Temp 97.5°F | Resp 16 | Wt 121.5 lb

## 2013-06-01 DIAGNOSIS — Z13220 Encounter for screening for lipoid disorders: Secondary | ICD-10-CM

## 2013-06-01 DIAGNOSIS — E119 Type 2 diabetes mellitus without complications: Secondary | ICD-10-CM

## 2013-06-01 DIAGNOSIS — R5381 Other malaise: Secondary | ICD-10-CM

## 2013-06-01 DIAGNOSIS — G8929 Other chronic pain: Secondary | ICD-10-CM

## 2013-06-01 DIAGNOSIS — F411 Generalized anxiety disorder: Secondary | ICD-10-CM

## 2013-06-01 DIAGNOSIS — F17200 Nicotine dependence, unspecified, uncomplicated: Secondary | ICD-10-CM

## 2013-06-01 DIAGNOSIS — G609 Hereditary and idiopathic neuropathy, unspecified: Secondary | ICD-10-CM

## 2013-06-01 DIAGNOSIS — E559 Vitamin D deficiency, unspecified: Secondary | ICD-10-CM

## 2013-06-01 DIAGNOSIS — R5383 Other fatigue: Secondary | ICD-10-CM

## 2013-06-01 DIAGNOSIS — Z720 Tobacco use: Secondary | ICD-10-CM

## 2013-06-01 DIAGNOSIS — M549 Dorsalgia, unspecified: Secondary | ICD-10-CM

## 2013-06-01 LAB — COMPREHENSIVE METABOLIC PANEL
ALK PHOS: 77 U/L (ref 39–117)
ALT: 16 U/L (ref 0–53)
AST: 15 U/L (ref 0–37)
Albumin: 4.9 g/dL (ref 3.5–5.2)
BUN: 16 mg/dL (ref 6–23)
CO2: 27 mEq/L (ref 19–32)
CREATININE: 0.8 mg/dL (ref 0.4–1.5)
Calcium: 9.7 mg/dL (ref 8.4–10.5)
Chloride: 103 mEq/L (ref 96–112)
GFR: 128.62 mL/min (ref >60.00)
GLUCOSE: 71 mg/dL (ref 70–99)
Potassium: 4.4 mEq/L (ref 3.5–5.1)
SODIUM: 138 meq/L (ref 135–145)
TOTAL PROTEIN: 6.8 g/dL (ref 6.0–8.3)
Total Bilirubin: 0.4 mg/dL (ref 0.3–1.2)

## 2013-06-01 LAB — CBC WITH DIFFERENTIAL/PLATELET
BASOS ABS: 0 10*3/uL (ref 0.0–0.1)
Basophils Relative: 0.7 % (ref 0.0–3.0)
Eosinophils Absolute: 0.2 10*3/uL (ref 0.0–0.7)
Eosinophils Relative: 2.6 % (ref 0.0–5.0)
HCT: 40.6 % (ref 39.0–52.0)
HEMOGLOBIN: 13.6 g/dL (ref 13.0–17.0)
LYMPHS ABS: 2.6 10*3/uL (ref 0.7–4.0)
Lymphocytes Relative: 42 % (ref 12.0–46.0)
MCHC: 33.5 g/dL (ref 30.0–36.0)
MCV: 91.2 fl (ref 78.0–100.0)
MONO ABS: 0.5 10*3/uL (ref 0.1–1.0)
MONOS PCT: 7.4 % (ref 3.0–12.0)
NEUTROS ABS: 3 10*3/uL (ref 1.4–7.7)
Neutrophils Relative %: 47.3 % (ref 43.0–77.0)
PLATELETS: 264 10*3/uL (ref 150.0–400.0)
RBC: 4.45 Mil/uL (ref 4.22–5.81)
RDW: 13.3 % (ref 11.5–14.6)
WBC: 6.2 10*3/uL (ref 4.5–10.5)

## 2013-06-01 LAB — LIPID PANEL
CHOLESTEROL: 175 mg/dL (ref 0–200)
HDL: 40.6 mg/dL (ref >39.00)
LDL Cholesterol: 104 mg/dL — ABNORMAL HIGH (ref 0–99)
TRIGLYCERIDES: 150 mg/dL — AB (ref 0.0–149.0)
Total CHOL/HDL Ratio: 4
VLDL: 30 mg/dL (ref 0.0–40.0)

## 2013-06-01 LAB — HEMOGLOBIN A1C: Hgb A1c MFr Bld: 5.9 % (ref 4.6–6.5)

## 2013-06-01 LAB — TSH: TSH: 1.15 u[IU]/mL (ref 0.35–5.50)

## 2013-06-01 MED ORDER — GABAPENTIN 100 MG PO CAPS: 100.0000 mg | ORAL_CAPSULE | Freq: Three times a day (TID) | ORAL | Status: DC

## 2013-06-01 MED ORDER — HYDROCODONE-ACETAMINOPHEN 5-325 MG PO TABS: 1.0000 | ORAL_TABLET | Freq: Two times a day (BID) | ORAL | Status: DC | PRN

## 2013-06-01 MED ORDER — ALPRAZOLAM 1 MG PO TABS: ORAL_TABLET | ORAL | Status: DC

## 2013-06-01 MED ORDER — SERTRALINE HCL 100 MG PO TABS: 100.0000 mg | ORAL_TABLET | Freq: Every day | ORAL | Status: DC

## 2013-06-01 MED ORDER — TRAMADOL HCL 50 MG PO TABS: ORAL_TABLET | ORAL | Status: DC

## 2013-06-01 NOTE — Progress Notes (Signed)
Patient ID: Corey Shaw, male   DOB: 1980-10-16, 33 y.o.   MRN: [id number]  Patient Active Problem List   Diagnosis Date Noted  . Back pain 05/03/2013  . Anxiety state, unspecified 08/24/2012  . Insomnia 12/02/2011  . Fx phalanges, hand-closed 08/03/2011  . Back pain with right-sided sciatica   . Diabetes mellitus type 2, diet-controlled   . Osgood-Schlatter/osteochondroses   . Tobacco abuse 05/16/2011    Subjective:  CC:   Chief Complaint  Patient presents with  . Follow-up  . Back Pain    HPI:   Corey CardBrian B Mathew is a 33 y.o. male who presents for  Follow up on chronic pain.  His back pain has not imporved or worsened, but he is having radicular pain from his anterior thigh to below the knee. Describes the sensation as a feeling stuck by needles.  Worse at night regardless of sleeping position .  Last week was the worst.  Trial of gabapentin   Past Medical History  Diagnosis Date  . Osgood-Schlatter/osteochondroses   . Chronic back pain greater than 3 months duration 2003    post injury. last MRI 2008: mild disk bulge L5-S1 no stenosis  . Diabetes mellitus     History reviewed. No pertinent past surgical history.     The following portions of the patient's history were reviewed and updated as appropriate: Allergies, current medications, and problem list.    Review of Systems:   Patient denies headache, fevers, malaise, unintentional weight loss, skin rash, eye pain, sinus congestion and sinus pain, sore throat, dysphagia,  hemoptysis , cough, dyspnea, wheezing, chest pain, palpitations, orthopnea, edema, abdominal pain, nausea, melena, diarrhea, constipation, flank pain, dysuria, hematuria, urinary  Frequency, nocturia, numbness, tingling, seizures,  Focal weakness, Loss of consciousness,  Tremor, insomnia, depression, anxiety, and suicidal ideation.     History   Social History  . Marital Status: Married    Spouse Name: N/A    Number of Children: N/A  .  Years of Education: N/A   Occupational History  . Not on file.   Social History Main Topics  . Smoking status: Current Every Day Smoker    Types: Cigarettes  . Smokeless tobacco: Never Used  . Alcohol Use: No  . Drug Use: Not on file  . Sexual Activity: Not on file   Other Topics Concern  . Not on file   Social History Narrative  . No narrative on file    Objective:  Filed Vitals:   06/01/13 0906  BP: 112/68  Pulse: 78  Temp: 97.5 F (36.4 C)  Resp: 16     General appearance: alert, cooperative and appears stated age Ears: normal TM's and external ear canals both ears Throat: lips, mucosa, and tongue normal; teeth and gums normal Neck: no adenopathy, no carotid bruit, supple, symmetrical, trachea midline and thyroid not enlarged, symmetric, no tenderness/mass/nodules Back: symmetric, no curvature. ROM normal. No CVA tenderness. Lungs: clear to auscultation bilaterally Heart: regular rate and rhythm, S1, S2 normal, no murmur, click, rub or gallop Abdomen: soft, non-tender; bowel sounds normal; no masses,  no organomegaly Pulses: 2+ and symmetric Skin: Skin color, texture, turgor normal. No rashes or lesions Lymph nodes: Cervical, supraclavicular, and axillary nodes normal.  Assessment and Plan:  Back pain with right-sided sciatica MRI of lumbar spine noted only minimal left paracentral protrusion at L5-S1  With no signs of foraminal or spinal stenosis.  Given his radiclar symptoms he was given lyrica samples which helped his pain.  Will try gabapentin first. 100 to 300  Mg daily  Refeeral for EMG/ nerve conduction studies   Diabetes mellitus type 2, diet-controlled Historically Well-controlled on diet alone .  hemoglobin A1c has been consistently less than 7.0 . Patient is soon to be up-to-date on eye exams and foot exam was done today.  .  Fasting lipids have been ordered.    Tobacco abuse Prior trial of Chantix was not tolerated due to abdominal pain.Marland Kitchen  He is  not currently motivated to quit.      Updated Medication List Outpatient Encounter Prescriptions as of 06/01/2013  Medication Sig  . ALPRAZolam (XANAX) 1 MG tablet TAKE ONE TABLET BY MOUTH AT BEDTIME AS NEEDED FOR SLEEP OR ANXIETY  . HYDROcodone-acetaminophen (NORCO/VICODIN) 5-325 MG per tablet Take 1 tablet by mouth 2 (two) times daily as needed.  . methocarbamol (ROBAXIN) 750 MG tablet TAKE 1 TABLET BY MOUTH 3 TIMES A DAY AS NEEDED FOR MUSCLE SPASMS  . sertraline (ZOLOFT) 100 MG tablet Take 1 tablet (100 mg total) by mouth daily.  . traMADol (ULTRAM) 50 MG tablet TAKE TWO TABLETS BY MOUTH EVERY 6 HOURS AS NEEDED FOR PAIN  . [DISCONTINUED] ALPRAZolam (XANAX) 1 MG tablet TAKE ONE TABLET BY MOUTH AT BEDTIME AS NEEDED FOR SLEEP OR ANXIETY  . [DISCONTINUED] HYDROcodone-acetaminophen (NORCO/VICODIN) 5-325 MG per tablet Take 1 tablet by mouth at bedtime as needed.  . [DISCONTINUED] sertraline (ZOLOFT) 100 MG tablet Take 1 tablet (100 mg total) by mouth daily.  . [DISCONTINUED] traMADol (ULTRAM) 50 MG tablet TAKE TWO TABLETS BY MOUTH EVERY 6 HOURS AS NEEDED FOR PAIN  . gabapentin (NEURONTIN) 100 MG capsule Take 1 capsule (100 mg total) by mouth 3 (three) times daily.  . pregabalin (LYRICA) 75 MG capsule Take 1 capsule (75 mg total) by mouth 2 (two) times daily.     Orders Placed This Encounter  Procedures  . NCV with EMG(electromyography)    No Follow-up on file.

## 2013-06-01 NOTE — Progress Notes (Signed)
Pre-visit discussion using our clinic review tool. No additional management support is needed unless otherwise documented below in the visit note.  

## 2013-06-01 NOTE — Patient Instructions (Signed)
We do not have samples of lyrica so I would like you to try gabapentin  At bedtim eunitl we can get it authorized  I have increased your vicodin to two tablets daily as needed   EMG nerve conduction studies to determine if you have a neuropathy

## 2013-06-01 NOTE — Telephone Encounter (Signed)
Relevant patient education assigned to patient using Emmi. ° °

## 2013-06-02 ENCOUNTER — Encounter: Payer: Self-pay | Admitting: Internal Medicine

## 2013-06-02 LAB — VITAMIN D 25 HYDROXY (VIT D DEFICIENCY, FRACTURES): Vit D, 25-Hydroxy: 33 ng/mL (ref 30–89)

## 2013-06-02 NOTE — Assessment & Plan Note (Addendum)
MRI of lumbar spine noted only minimal left paracentral protrusion at L5-S1  With no signs of foraminal or spinal stenosis.  Given his radiclar symptoms he was given lyrica samples which helped his pain.  Will try gabapentin first. 100 to 300  Mg daily  Refeeral for EMG/ nerve conduction studies

## 2013-06-02 NOTE — Assessment & Plan Note (Signed)
Prior trial of Chantix was not tolerated due to abdominal pain.Marland Kitchen.  He is not currently motivated to quit.

## 2013-06-02 NOTE — Assessment & Plan Note (Signed)
Historically Well-controlled on diet alone .  hemoglobin A1c has been consistently less than 7.0 . Patient is soon to be up-to-date on eye exams and foot exam was done today.  .  Fasting lipids have been ordered.

## 2013-06-03 ENCOUNTER — Encounter: Payer: Self-pay | Admitting: Emergency Medicine

## 2013-06-07 NOTE — Telephone Encounter (Signed)
Mailed unread message to pt  

## 2013-06-16 ENCOUNTER — Other Ambulatory Visit: Payer: Self-pay

## 2013-07-01 ENCOUNTER — Telehealth: Payer: Self-pay | Admitting: Internal Medicine

## 2013-07-01 MED ORDER — HYDROCODONE-ACETAMINOPHEN 5-325 MG PO TABS: 1.0000 | ORAL_TABLET | Freq: Two times a day (BID) | ORAL | Status: DC | PRN

## 2013-07-01 NOTE — Telephone Encounter (Signed)
Patinet is not overdue,  He is due tomorrow,  rx printed.

## 2013-07-01 NOTE — Telephone Encounter (Signed)
Left message, notifying Rx ready for pickup 

## 2013-07-01 NOTE — Telephone Encounter (Signed)
Please advise ok to fill last fill was 06/01/13?

## 2013-07-01 NOTE — Telephone Encounter (Signed)
Wife's phone:  [phone] (he broke his)  States he is overdue to pick up rx for Vicodin.  Would like to pick up asap, having a lot of pain today.  Please advise when ready.

## 2013-07-12 ENCOUNTER — Telehealth: Payer: Self-pay | Admitting: *Deleted

## 2013-07-12 NOTE — Telephone Encounter (Signed)
PA request form for the Lyrica placed in Dr.tullo box

## 2013-07-14 DIAGNOSIS — Z0279 Encounter for issue of other medical certificate: Secondary | ICD-10-CM

## 2013-08-04 ENCOUNTER — Telehealth: Payer: Self-pay | Admitting: Internal Medicine

## 2013-08-04 NOTE — Telephone Encounter (Signed)
Pt called to get a refill on vicodin Please call when ready for pick

## 2013-08-04 NOTE — Telephone Encounter (Signed)
Please advise last fill, 07/01/13

## 2013-08-05 MED ORDER — HYDROCODONE-ACETAMINOPHEN 5-325 MG PO TABS: 1.0000 | ORAL_TABLET | Freq: Two times a day (BID) | ORAL | Status: DC | PRN

## 2013-08-05 NOTE — Telephone Encounter (Signed)
Ok to refill,  printed rx  

## 2013-08-05 NOTE — Telephone Encounter (Signed)
Patient notified script ready for pick up, placed up front as requested.

## 2013-08-25 ENCOUNTER — Encounter: Payer: Self-pay | Admitting: Internal Medicine

## 2013-08-25 ENCOUNTER — Ambulatory Visit (INDEPENDENT_AMBULATORY_CARE_PROVIDER_SITE_OTHER): Payer: Medicaid Other | Admitting: Internal Medicine

## 2013-08-25 VITALS — BP 116/82 | HR 79 | Temp 97.6°F | Resp 16 | Ht 68.75 in | Wt 119.2 lb

## 2013-08-25 DIAGNOSIS — M5416 Radiculopathy, lumbar region: Secondary | ICD-10-CM

## 2013-08-25 DIAGNOSIS — G8929 Other chronic pain: Secondary | ICD-10-CM

## 2013-08-25 DIAGNOSIS — Z720 Tobacco use: Secondary | ICD-10-CM

## 2013-08-25 DIAGNOSIS — Z113 Encounter for screening for infections with a predominantly sexual mode of transmission: Secondary | ICD-10-CM

## 2013-08-25 DIAGNOSIS — M5431 Sciatica, right side: Secondary | ICD-10-CM

## 2013-08-25 DIAGNOSIS — G609 Hereditary and idiopathic neuropathy, unspecified: Secondary | ICD-10-CM

## 2013-08-25 DIAGNOSIS — R209 Unspecified disturbances of skin sensation: Secondary | ICD-10-CM

## 2013-08-25 DIAGNOSIS — R200 Anesthesia of skin: Secondary | ICD-10-CM

## 2013-08-25 DIAGNOSIS — IMO0002 Reserved for concepts with insufficient information to code with codable children: Secondary | ICD-10-CM

## 2013-08-25 DIAGNOSIS — E119 Type 2 diabetes mellitus without complications: Secondary | ICD-10-CM

## 2013-08-25 DIAGNOSIS — F17200 Nicotine dependence, unspecified, uncomplicated: Secondary | ICD-10-CM

## 2013-08-25 DIAGNOSIS — R202 Paresthesia of skin: Secondary | ICD-10-CM

## 2013-08-25 DIAGNOSIS — Z0000 Encounter for general adult medical examination without abnormal findings: Secondary | ICD-10-CM

## 2013-08-25 DIAGNOSIS — M5430 Sciatica, unspecified side: Secondary | ICD-10-CM

## 2013-08-25 LAB — VITAMIN B12: Vitamin B-12: 290 pg/mL (ref 211–911)

## 2013-08-25 MED ORDER — HYDROCODONE-ACETAMINOPHEN 5-325 MG PO TABS: 1.0000 | ORAL_TABLET | Freq: Two times a day (BID) | ORAL | Status: DC | PRN

## 2013-08-25 MED ORDER — PREGABALIN 75 MG PO CAPS: 75.0000 mg | ORAL_CAPSULE | Freq: Two times a day (BID) | ORAL | Status: DC

## 2013-08-25 NOTE — Patient Instructions (Addendum)
You had your annual  wellness exam today   I am screening you for B12 deficiency, HIV and Hep C with the labs today  TENS Units may be purchased at medical supply stores but call first to make sure they have them  [facility] in [place] on [address] is worth trying  We are going to switch back to Lyrica if insurance will allow it (instead of gabapentin)   Please make an appt with the front desk for a  Follow up appt in 3 months  We will contact you with the bloodwork results

## 2013-08-25 NOTE — Progress Notes (Signed)
Patient ID: Corey CardBrian B Amster, male   DOB: November 24, 1980, 33 y.o.   MRN: [id number] The patient is here for his annual male physical examination and management of other chronic and acute problems.   The risk factors are reflected in the social history.  The roster of all physicians providing medical care to patient - is listed in the Snapshot section of the chart.  Activities of daily living:  The patient is 100% independent in all ADLs: dressing, toileting, feeding as well as independent mobility  Home safety : The patient has smoke detectors in the home. He wears seatbelts.  There are no firearms at home. There is no violence in the home.   There is no risks for hepatitis, STDs or HIV. There is no   history of blood transfusion. There is no travel history to infectious disease endemic areas of the world.  The patient has seen their dentist in the last six month and  their eye doctor in the last year.  They do not  have excessive sun exposure. They have seen a dermatoloigist in the last year. Discussed the need for sun protection: hats, long sleeves and use of sunscreen if there is significant sun exposure.   Diet: the importance of a healthy diet is discussed. They do have a healthy diet.  The benefits of regular aerobic exercise were discussed. He exercises a minimum of 30 minutes  5 days per week. Depression screen: there are no signs or vegative symptoms of depression- irritability, change in appetite, anhedonia, sadness/tearfullness.  The following portions of the patient's history were reviewed and updated as appropriate: allergies, current medications, past family history, past medical history,  past surgical history, past social history  and problem list.  Visual acuity was not assessed per patient preference since he has regular follow up with his ophthalmologist. Hearing and body mass index were assessed and reviewed.   During the course of the visit the patient was educated and counseled  about appropriate screening and preventive services including :  nutrition counseling, colorectal cancer screening, and recommended immunizations.    Objective:  BP 116/82  Pulse 79  Temp(Src) 97.6 F (36.4 C) (Oral)  Resp 16  Ht 5' 8.75" (1.746 m)  Wt 119 lb 4 oz (54.091 kg)  BMI 17.74 kg/m2  SpO2 99%  General Appearance:    Alert, cooperative, no distress, appears stated age  Head:    Normocephalic, without obvious abnormality, atraumatic  Eyes:    PERRL, conjunctiva/corneas clear, EOM's intact, fundi    benign, both eyes       Ears:    Normal TM's and external ear canals, both ears  Nose:   Nares normal, septum midline, mucosa normal, no drainage   or sinus tenderness  Throat:   Lips, mucosa, and tongue normal; teeth and gums normal  Neck:   Supple, symmetrical, trachea midline, no adenopathy;       thyroid:  No enlargement/tenderness/nodules; no carotid   bruit or JVD  Back:     Symmetric, no curvature, ROM normal, no CVA tenderness  Lungs:     Clear to auscultation bilaterally, respirations unlabored  Chest wall:    No tenderness or deformity  Heart:    Regular rate and rhythm, S1 and S2 normal, no murmur, rub   or gallop  Abdomen:     Soft, non-tender, bowel sounds active all four quadrants,    no masses, no organomegaly  Extremities:   Extremities normal, atraumatic, no cyanosis or edema  Pulses:   2+ and symmetric all extremities  Skin:   Skin color, texture, turgor normal, no rashes or lesions  Lymph nodes:   Cervical, supraclavicular, and axillary nodes normal  Neurologic:   CNII-XII intact. Normal strength, sensation and reflexes      throughout   Assessment and plan:  Diabetes mellitus type 2, diet-controlled Well-controlled on diet alone .  hemoglobin A1c has been consistently less than 7.0 . Patient is up-to-date on foot exams and eye exam was advised.  Lab Results  Component Value Date   HGBA1C 5.9 06/01/2013    Back pain with right-sided  sciatica Minimal disk disease by recent MRI,  But requiring vicodin for control od pain.  Lyrica rx given, TENS unit requested  Tobacco abuse Smoking cessation instruction/counseling given:  counseled patient on the dangers of tobacco use, advised patient to stop smoking, and reviewed strategies to maximize success  Numbness and tingling of both legs Checking B12 level  Encounter for preventive health examination Annual exam was done excluding testicular and prostate exam. . Screening for Hep C and HIV discussed and done.    Updated Medication List Outpatient Encounter Prescriptions as of 08/25/2013  Medication Sig  . ALPRAZolam (XANAX) 1 MG tablet TAKE ONE TABLET BY MOUTH AT BEDTIME AS NEEDED FOR SLEEP OR ANXIETY  . gabapentin (NEURONTIN) 100 MG capsule Take 1 capsule (100 mg total) by mouth 3 (three) times daily.  Marland Kitchen. HYDROcodone-acetaminophen (NORCO/VICODIN) 5-325 MG per tablet Take 1 tablet by mouth 2 (two) times daily as needed. May refill on or after June 28  . sertraline (ZOLOFT) 100 MG tablet Take 1 tablet (100 mg total) by mouth daily.  . traMADol (ULTRAM) 50 MG tablet TAKE TWO TABLETS BY MOUTH EVERY 6 HOURS AS NEEDED FOR PAIN  . [DISCONTINUED] HYDROcodone-acetaminophen (NORCO/VICODIN) 5-325 MG per tablet Take 1 tablet by mouth 2 (two) times daily as needed.  . methocarbamol (ROBAXIN) 750 MG tablet TAKE 1 TABLET BY MOUTH 3 TIMES A DAY AS NEEDED FOR MUSCLE SPASMS  . pregabalin (LYRICA) 75 MG capsule Take 1 capsule (75 mg total) by mouth 2 (two) times daily.  . [DISCONTINUED] pregabalin (LYRICA) 75 MG capsule Take 1 capsule (75 mg total) by mouth 2 (two) times daily.

## 2013-08-25 NOTE — Progress Notes (Signed)
Pre-visit discussion using our clinic review tool. No additional management support is needed unless otherwise documented below in the visit note.  

## 2013-08-26 ENCOUNTER — Encounter: Payer: Self-pay | Admitting: Internal Medicine

## 2013-08-26 LAB — HIV ANTIBODY (ROUTINE TESTING W REFLEX): HIV: NONREACTIVE

## 2013-08-26 LAB — HEPATITIS C ANTIBODY: HCV Ab: NEGATIVE

## 2013-08-28 DIAGNOSIS — R200 Anesthesia of skin: Secondary | ICD-10-CM | POA: Insufficient documentation

## 2013-08-28 DIAGNOSIS — Z0000 Encounter for general adult medical examination without abnormal findings: Secondary | ICD-10-CM | POA: Insufficient documentation

## 2013-08-28 DIAGNOSIS — R202 Paresthesia of skin: Secondary | ICD-10-CM

## 2013-08-28 NOTE — Assessment & Plan Note (Signed)
Smoking cessation instruction/counseling given:  counseled patient on the dangers of tobacco use, advised patient to stop smoking, and reviewed strategies to maximize success 

## 2013-08-28 NOTE — Assessment & Plan Note (Addendum)
Minimal disk disease by recent MRI,  But requiring vicodin for control od pain.  Lyrica rx given, TENS unit requested

## 2013-08-28 NOTE — Assessment & Plan Note (Signed)
Checking B12 level.  °

## 2013-08-28 NOTE — Assessment & Plan Note (Addendum)
Well-controlled on diet alone .  hemoglobin A1c has been consistently less than 7.0 . Patient is up-to-date on foot exams and eye exam was advised.  Lab Results  Component Value Date   HGBA1C 5.9 06/01/2013

## 2013-08-28 NOTE — Assessment & Plan Note (Signed)
Annual exam was done excluding testicular and prostate exam. . Screening for Hep C and HIV discussed and done.

## 2013-08-31 NOTE — Telephone Encounter (Signed)
Pt notified of unread mychart message and labs. Would like to come in for injections. Nurse visit scheduled 09/06/13.  

## 2013-08-31 NOTE — Telephone Encounter (Signed)
Pt notified of unread mychart message and labs. Would like to come in for injections. Nurse visit scheduled 09/06/13.

## 2013-09-01 ENCOUNTER — Ambulatory Visit (INDEPENDENT_AMBULATORY_CARE_PROVIDER_SITE_OTHER): Payer: Medicaid Other | Admitting: *Deleted

## 2013-09-01 DIAGNOSIS — E538 Deficiency of other specified B group vitamins: Secondary | ICD-10-CM

## 2013-09-01 MED ORDER — CYANOCOBALAMIN 1000 MCG/ML IJ SOLN
1000.0000 ug | Freq: Once | INTRAMUSCULAR | Status: AC
  Administered 2013-09-01: 1000 ug via INTRAMUSCULAR

## 2013-09-06 ENCOUNTER — Ambulatory Visit: Payer: Medicaid Other

## 2013-09-08 ENCOUNTER — Ambulatory Visit (INDEPENDENT_AMBULATORY_CARE_PROVIDER_SITE_OTHER): Payer: Medicaid Other | Admitting: *Deleted

## 2013-09-08 DIAGNOSIS — E538 Deficiency of other specified B group vitamins: Secondary | ICD-10-CM

## 2013-09-08 MED ORDER — CYANOCOBALAMIN 1000 MCG/ML IJ SOLN
1000.0000 ug | Freq: Once | INTRAMUSCULAR | Status: AC
  Administered 2013-09-08: 1000 ug via INTRAMUSCULAR

## 2013-09-14 ENCOUNTER — Other Ambulatory Visit: Payer: Self-pay | Admitting: Internal Medicine

## 2013-09-14 NOTE — Telephone Encounter (Signed)
Last refill 6.12.15, last OV 6.18.15, future OV 7.9.15.  please advise refill.

## 2013-09-15 ENCOUNTER — Ambulatory Visit (INDEPENDENT_AMBULATORY_CARE_PROVIDER_SITE_OTHER): Payer: Medicaid Other | Admitting: *Deleted

## 2013-09-15 DIAGNOSIS — E538 Deficiency of other specified B group vitamins: Secondary | ICD-10-CM

## 2013-09-15 MED ORDER — CYANOCOBALAMIN 1000 MCG/ML IJ SOLN
1000.0000 ug | Freq: Once | INTRAMUSCULAR | Status: AC
  Administered 2013-09-15: 1000 ug via INTRAMUSCULAR

## 2013-09-15 NOTE — Telephone Encounter (Signed)
Ok to refill,  Refill sent  

## 2013-09-22 ENCOUNTER — Ambulatory Visit (INDEPENDENT_AMBULATORY_CARE_PROVIDER_SITE_OTHER): Payer: Medicaid Other | Admitting: *Deleted

## 2013-09-22 DIAGNOSIS — E538 Deficiency of other specified B group vitamins: Secondary | ICD-10-CM

## 2013-09-22 MED ORDER — CYANOCOBALAMIN 1000 MCG/ML IJ SOLN
1000.0000 ug | Freq: Once | INTRAMUSCULAR | Status: AC
  Administered 2013-09-22: 1000 ug via INTRAMUSCULAR

## 2013-10-04 ENCOUNTER — Telehealth: Payer: Self-pay | Admitting: Internal Medicine

## 2013-10-04 MED ORDER — HYDROCODONE-ACETAMINOPHEN 5-325 MG PO TABS: 1.0000 | ORAL_TABLET | Freq: Two times a day (BID) | ORAL | Status: DC | PRN

## 2013-10-04 NOTE — Telephone Encounter (Signed)
Patient called and requested refill on Hydrocodone last fill 09/04/13 please advise ok to fill? Patient aware physician not in until 1 today.

## 2013-10-04 NOTE — Telephone Encounter (Signed)
Patient medication will be ready after one today.

## 2013-10-04 NOTE — Telephone Encounter (Signed)
Ok to refill,  printed rx  

## 2013-10-05 ENCOUNTER — Encounter: Payer: Self-pay | Admitting: Internal Medicine

## 2013-10-05 ENCOUNTER — Ambulatory Visit (INDEPENDENT_AMBULATORY_CARE_PROVIDER_SITE_OTHER): Payer: Medicaid Other | Admitting: Internal Medicine

## 2013-10-05 VITALS — BP 120/74 | HR 50 | Temp 97.6°F | Resp 16 | Ht 68.75 in | Wt 126.8 lb

## 2013-10-05 DIAGNOSIS — E119 Type 2 diabetes mellitus without complications: Secondary | ICD-10-CM

## 2013-10-05 DIAGNOSIS — M5430 Sciatica, unspecified side: Secondary | ICD-10-CM

## 2013-10-05 DIAGNOSIS — M5431 Sciatica, right side: Secondary | ICD-10-CM

## 2013-10-05 DIAGNOSIS — G589 Mononeuropathy, unspecified: Secondary | ICD-10-CM

## 2013-10-05 DIAGNOSIS — M792 Neuralgia and neuritis, unspecified: Secondary | ICD-10-CM

## 2013-10-05 DIAGNOSIS — G609 Hereditary and idiopathic neuropathy, unspecified: Secondary | ICD-10-CM

## 2013-10-05 DIAGNOSIS — G629 Polyneuropathy, unspecified: Secondary | ICD-10-CM

## 2013-10-05 NOTE — Assessment & Plan Note (Addendum)
Back pain has been worse for the last month pain and is worse when he first wakes up suggesting OA.  He is currently taking tramadol,  vicdoin and now also taking  Lyrica.  Discussed referral to Pain management for consideration of epidural injections, but given his new onset neuropathy will refer to Neurology first  For nerve conductions studies.

## 2013-10-05 NOTE — Progress Notes (Signed)
Pre-visit discussion using our clinic review tool. No additional management support is needed unless otherwise documented below in the visit note.  

## 2013-10-05 NOTE — Progress Notes (Signed)
Patient ID: Corey Shaw, male   DOB: 01/08/1981, 33 y.o.   MRN: [id number]  Patient Active Problem List   Diagnosis Date Noted  . Neuropathy 10/07/2013  . Numbness and tingling of both legs 08/28/2013  . Encounter for preventive health examination 08/28/2013  . Anxiety state, unspecified 08/24/2012  . Insomnia 12/02/2011  . Fx phalanges, hand-closed 08/03/2011  . Back pain with right-sided sciatica   . Diabetes mellitus type 2, diet-controlled   . Osgood-Schlatter/osteochondroses   . Tobacco abuse 05/16/2011    Subjective:  CC:   Chief Complaint  Patient presents with  . Follow-up    Nerve conduction study  . Back Pain    HPI:   Corey CardBrian B Housand is a 33 y.o. male who presents for   Past Medical History  Diagnosis Date  . Osgood-Schlatter/osteochondroses   . Chronic back pain greater than 3 months duration 2003    post injury. last MRI 2008: mild disk bulge L5-S1 no stenosis  . Diabetes mellitus     History reviewed. No pertinent past surgical history.   Follow up on chronic  back pain, managed with daytime tramadol and evening vicodin.  MRI done several months ago was negative for spinal stenosis.  He continues to have moderate to severe pain requiring the use of medications round the clock.  He also notes stiffness in the morning upo rising that lasted < 30 minutes.   New cc of burning pain in both feet, more pronounced on  left side than than right , occurring initially after  standing cor several hours at a yard sale.  Initial episode  lasted an hour and resolved spontaneously  .  2nd episode occurred 3 days later, during the evening,  Lasted about  30 minutes.  He Describes pain as burning,  No change in color noted to feet and no swelling ,  No warmth.     The following portions of the patient's history were reviewed and updated as appropriate: Allergies, current medications, and problem list.    Review of Systems:   Patient denies headache, fevers,  malaise, unintentional weight loss, skin rash, eye pain, sinus congestion and sinus pain, sore throat, dysphagia,  hemoptysis , cough, dyspnea, wheezing, chest pain, palpitations, orthopnea, edema, abdominal pain, nausea, melena, diarrhea, constipation, flank pain, dysuria, hematuria, urinary  Frequency, nocturia, numbness, tingling, seizures,  Focal weakness, Loss of consciousness,  Tremor, insomnia, depression, anxiety, and suicidal ideation.     History   Social History  . Marital Status: Married    Spouse Name: N/A    Number of Children: N/A  . Years of Education: N/A   Occupational History  . Not on file.   Social History Main Topics  . Smoking status: Current Every Day Smoker    Types: Cigarettes  . Smokeless tobacco: Never Used  . Alcohol Use: No  . Drug Use: Not on file  . Sexual Activity: Not on file   Other Topics Concern  . Not on file   Social History Narrative  . No narrative on file    Objective:  Filed Vitals:   10/05/13 0807  BP: 120/74  Pulse: 50  Temp: 97.6 F (36.4 C)  Resp: 16     General appearance: alert, cooperative and appears stated age Back: symmetric, no curvature. No vertebral tenderness.  ROM normal. No CVA tenderness. Lungs: clear to auscultation bilaterally Heart: regular rate and rhythm, S1, S2 normal, no murmur, click, rub or gallop Abdomen: soft, non-tender; bowel sounds  normal; no masses,  no organomegaly Pulses: 2+ and symmetric Skin: Skin color, texture, turgor normal. No rashes or lesions Lymph nodes: Cervical, supraclavicular, and axillary nodes normal. Neuro: CNs 2-12 intact. DTRs 2+/4 in biceps, brachioradialis, patellars and achilles. Muscle strength 5/5 in upper and lower exremities. Fine resting tremor bilaterally both hands cerebellar function normal. Romberg negative.  No pronator drift.   Gait normal.   Assessment and Plan:  Back pain with right-sided sciatica Back pain has been worse for the last month pain and is  worse when he first wakes up suggesting OA.  He is currently taking tramadol,  vicdoin and now also taking  Lyrica.  Discussed referral to Pain management for consideration of epidural injections, but given his new onset neuropathy will refer to Neurology first  For nerve conductions studies.    Neuropathy New onset,  With type 2 DM recently diagnosed but controlled with diet.  TSH and B12 are normal.  Referal to Neurology for evaluation   Lab Results  Component Value Date   TSH 1.15 06/01/2013   Lab Results  Component Value Date   HGBA1C 5.9 06/01/2013   Lab Results  Component Value Date   VITAMINB12 290 08/25/2013      Updated Medication List Outpatient Encounter Prescriptions as of 10/05/2013  Medication Sig  . ALPRAZolam (XANAX) 1 MG tablet TAKE ONE TABLET BY MOUTH AT BEDTIME AS NEEDED FOR SLEEP OR ANXIETY  . HYDROcodone-acetaminophen (NORCO/VICODIN) 5-325 MG per tablet Take 1 tablet by mouth 2 (two) times daily as needed. May refill on or after July 28  . pregabalin (LYRICA) 75 MG capsule Take 1 capsule (75 mg total) by mouth 2 (two) times daily.  . sertraline (ZOLOFT) 100 MG tablet TAKE 1 TABLET BY MOUTH ONCE A DAY  . traMADol (ULTRAM) 50 MG tablet TAKE TWO TABLETS BY MOUTH EVERY 6 HOURS AS NEEDED FOR PAIN  . [DISCONTINUED] gabapentin (NEURONTIN) 100 MG capsule Take 1 capsule (100 mg total) by mouth 3 (three) times daily.  . methocarbamol (ROBAXIN) 750 MG tablet TAKE 1 TABLET BY MOUTH 3 TIMES A DAY AS NEEDED FOR MUSCLE SPASMS     Orders Placed This Encounter  Procedures  . RPR  . Folate RBC  . Ambulatory referral to Neurology    No Follow-up on file.

## 2013-10-06 LAB — FOLATE RBC: RBC Folate: 318 ng/mL (ref >280)

## 2013-10-06 LAB — RPR

## 2013-10-07 ENCOUNTER — Encounter: Payer: Self-pay | Admitting: Internal Medicine

## 2013-10-07 DIAGNOSIS — G629 Polyneuropathy, unspecified: Secondary | ICD-10-CM | POA: Insufficient documentation

## 2013-10-07 LAB — HM DIABETES FOOT EXAM: HM Diabetic Foot Exam: NORMAL

## 2013-10-07 NOTE — Assessment & Plan Note (Signed)
New onset,  With type 2 DM recently diagnosed but controlled with diet.  TSH and B12 are normal.  Referal to Neurology for evaluation   Lab Results  Component Value Date   TSH 1.15 06/01/2013   Lab Results  Component Value Date   HGBA1C 5.9 06/01/2013   Lab Results  Component Value Date   VITAMINB12 290 08/25/2013

## 2013-10-10 NOTE — Telephone Encounter (Signed)
Mailed unread message to pt  

## 2013-10-25 ENCOUNTER — Ambulatory Visit: Payer: Medicaid Other

## 2013-11-04 ENCOUNTER — Telehealth: Payer: Self-pay | Admitting: Internal Medicine

## 2013-11-04 MED ORDER — HYDROCODONE-ACETAMINOPHEN 5-325 MG PO TABS: 1.0000 | ORAL_TABLET | Freq: Two times a day (BID) | ORAL | Status: DC | PRN

## 2013-11-04 NOTE — Telephone Encounter (Signed)
3 months of vicodin (aug Sept Oct ) printed)

## 2013-11-04 NOTE — Telephone Encounter (Signed)
Patient needs refill for Norco last fill 10/04/13 please advise.

## 2013-11-04 NOTE — Telephone Encounter (Signed)
Patient notified scripts placed at front desk. Detailed message scripts for Aug, Sept, and Oct.

## 2013-11-08 ENCOUNTER — Other Ambulatory Visit: Payer: Self-pay | Admitting: *Deleted

## 2013-11-08 ENCOUNTER — Encounter: Payer: Self-pay | Admitting: Internal Medicine

## 2013-11-08 NOTE — Telephone Encounter (Signed)
Last visit 10/05/13, ok refill? 

## 2013-11-09 MED ORDER — TRAMADOL HCL 50 MG PO TABS: ORAL_TABLET | ORAL | Status: DC

## 2013-11-09 NOTE — Telephone Encounter (Signed)
Ok to refill,  printed rx  

## 2014-01-09 ENCOUNTER — Other Ambulatory Visit: Payer: Self-pay | Admitting: *Deleted

## 2014-01-09 NOTE — Telephone Encounter (Signed)
Last visit 10/05/13, ok refill? 

## 2014-01-11 ENCOUNTER — Other Ambulatory Visit: Payer: Self-pay | Admitting: Internal Medicine

## 2014-01-11 MED ORDER — ALPRAZOLAM 1 MG PO TABS: ORAL_TABLET | ORAL | Status: DC

## 2014-01-11 NOTE — Telephone Encounter (Signed)
Ok to refill,  printed rx  

## 2014-01-11 NOTE — Telephone Encounter (Signed)
Last OV 7.29.15, last refill 10.5.15.  Please advise refill.

## 2014-01-11 NOTE — Telephone Encounter (Signed)
Faxed to pharmacy

## 2014-02-08 ENCOUNTER — Other Ambulatory Visit: Payer: Self-pay | Admitting: Internal Medicine

## 2014-02-13 ENCOUNTER — Telehealth: Payer: Self-pay | Admitting: Internal Medicine

## 2014-02-13 MED ORDER — HYDROCODONE-ACETAMINOPHEN 5-325 MG PO TABS: 1.0000 | ORAL_TABLET | Freq: Two times a day (BID) | ORAL | Status: DC | PRN

## 2014-02-13 NOTE — Telephone Encounter (Signed)
Patient notified and RX placed at front desk.

## 2014-02-13 NOTE — Telephone Encounter (Signed)
Patient called requested refill on Norco 11/04/13 ok to fill?

## 2014-02-13 NOTE — Telephone Encounter (Signed)
Ok to refill,  printed rx  

## 2014-02-22 ENCOUNTER — Encounter: Payer: Self-pay | Admitting: Internal Medicine

## 2014-02-22 ENCOUNTER — Ambulatory Visit (INDEPENDENT_AMBULATORY_CARE_PROVIDER_SITE_OTHER): Payer: Medicaid Other | Admitting: Internal Medicine

## 2014-02-22 VITALS — BP 120/62 | HR 73 | Temp 98.5°F | Resp 16 | Ht 68.75 in | Wt 127.4 lb

## 2014-02-22 DIAGNOSIS — G629 Polyneuropathy, unspecified: Secondary | ICD-10-CM

## 2014-02-22 DIAGNOSIS — M546 Pain in thoracic spine: Secondary | ICD-10-CM

## 2014-02-22 DIAGNOSIS — M25571 Pain in right ankle and joints of right foot: Secondary | ICD-10-CM

## 2014-02-22 DIAGNOSIS — S96911A Strain of unspecified muscle and tendon at ankle and foot level, right foot, initial encounter: Secondary | ICD-10-CM

## 2014-02-22 MED ORDER — PREGABALIN 100 MG PO CAPS: 100.0000 mg | ORAL_CAPSULE | Freq: Three times a day (TID) | ORAL | Status: DC

## 2014-02-22 MED ORDER — HYDROCODONE-ACETAMINOPHEN 5-325 MG PO TABS: 1.0000 | ORAL_TABLET | Freq: Two times a day (BID) | ORAL | Status: DC | PRN

## 2014-02-22 NOTE — Assessment & Plan Note (Addendum)
He was referred to neurology after last appt but missed the appt because he was unaware of the date.  His appt has been rescheduled ,  But the source may be thoracic spine given his current complaints.  Will increase th lyrica to 100 mg tid. Ri thoracic spine ordered.

## 2014-02-22 NOTE — Progress Notes (Signed)
Pre visit review using our clinic review tool, if applicable. No additional management support is needed unless otherwise documented below in the visit note. 

## 2014-02-22 NOTE — Patient Instructions (Signed)
Plain films of right ankle to rule out fracture,  If normal  will refer to sports medicine for therapy  MRI thoracic spine to evaluate for spinal stenosis  I have increased the lyrica to 100 mg three times daily   Your appt with dr Malvin JohnsPotter is rescheduled for jan 5th at 10:5 am [phone]

## 2014-02-22 NOTE — Progress Notes (Signed)
Patient ID: Corey SalenBrian Shaw, male   DOB: Aug 07, 1980, 33 y.o.   MRN: [id number]   Patient Active Problem List   Diagnosis Date Noted  . Right ankle strain 02/25/2014  . Neuropathy 10/07/2013  . Numbness and tingling of both legs 08/28/2013  . Encounter for preventive health examination 08/28/2013  . Anxiety state, unspecified 08/24/2012  . Insomnia 12/02/2011  . Fx phalanges, hand-closed 08/03/2011  . Back pain with right-sided sciatica   . Diabetes mellitus type 2, diet-controlled   . Osgood-Schlatter/osteochondroses   . Tobacco abuse 05/16/2011       Corey Shaw is a 33 y.o. male who presents for  1) Right ankle pain for the past 5 weeks , started a few days after spending 8 hours underneath his wife's car replacing the transmission and presented with swelling on the lateral side red and hot to the touch ,  Tried wrapping it and using ibuprofen .  Started no improvement.  pai nis at the base onf the malleolus and worse with plantar flexaion and eversion,  Still swollen  2) Reporting numbness on the right side of his mid thoracic spine for the past 2-3 weeks,  Starts tingling in an area the size of a baseball. Gradually enlarging .  Feet burning a lot at night, despite use  of vicodin   Never received the appt for the neurology appt which was set up with Dr Malvin JohnsPotter in September  Follow up on chronic  back pain, managed with daytime tramadol and evening vicodin.  MRI of lumbar spine done several months ago was negative for spinal stenosis.  He continues to have moderate to severe pain requiring the use of medications round the clock.  He also notes stiffness in the morning upo rising that lasted < 30 minutes.   New cc of burning pain in both feet, more pronounced on  left side than than right , occurring initially after  standing cor several hours at a yard sale.  Initial episode  lasted an hour and resolved spontaneously  .  2nd episode occurred 3 days later, during the evening,  Lasted  about  30 minutes.  He Describes pain as burning,  No change in color noted to feet and no swelling ,  No warmth.     Past Medical History  Diagnosis Date  . Osgood-Schlatter/osteochondroses   . Chronic back pain greater than 3 months duration 2003    post injury. last MRI 2008: mild disk bulge L5-S1 no stenosis  . Diabetes mellitus     No past surgical history on file.     The following portions of the patient's history were reviewed and updated as appropriate: Allergies, current medications, and problem list.    Review of Systems:   Patient denies headache, fevers, malaise, unintentional weight loss, skin rash, eye pain, sinus congestion and sinus pain, sore throat, dysphagia,  hemoptysis , cough, dyspnea, wheezing, chest pain, palpitations, orthopnea, edema, abdominal pain, nausea, melena, diarrhea, constipation, flank pain, dysuria, hematuria, urinary  Frequency, nocturia, numbness, tingling, seizures,  Focal weakness, Loss of consciousness,  Tremor, insomnia, depression, anxiety, and suicidal ideation.     History   Social History  . Marital Status: Married    Spouse Name: N/A    Number of Children: N/A  . Years of Education: N/A   Occupational History  . Not on file.   Social History Main Topics  . Smoking status: Current Every Day Smoker    Types: Cigarettes  . Smokeless tobacco:  Never Used  . Alcohol Use: No  . Drug Use: Not on file  . Sexual Activity: Not on file   Other Topics Concern  . Not on file   Social History Narrative    Objective:  Filed Vitals:   02/22/14 1626  BP: 120/62  Pulse: 73  Temp: 98.5 F (36.9 C)  Resp: 16     General appearance: alert, cooperative and appears stated age Ears: normal TM's and external ear canals both ears Throat: lips, mucosa, and tongue normal; teeth and gums normal Neck: no adenopathy, no carotid bruit, supple, symmetrical, trachea midline and thyroid not enlarged, symmetric, no  tenderness/mass/nodules Back: symmetric, no curvature. ROM normal. No CVA tenderness. Lungs: clear to auscultation bilaterally Heart: regular rate and rhythm, S1, S2 normal, no murmur, click, rub or gallop Abdomen: soft, non-tender; bowel sounds normal; no masses,  no organomegaly Pulses: 2+ and symmetric Skin: Skin color, texture, turgor normal. No rashes or lesions Lymph nodes: Cervical, supraclavicular, and axillary nodes normal.  Assessment and Plan:  Neuropathy He was referred to neurology after last appt but missed the appt because he was unaware of the date.  His appt has been rescheduled ,  But the source may be thoracic spine given his current complaints.  Will increase th lyrica to 100 mg tid. Ri thoracic spine ordered.    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  Plain films and sports medicine referral made.     Updated Medication List Outpatient Encounter Prescriptions as of 02/22/2014  Medication Sig  . ALPRAZolam (XANAX) 1 MG tablet TAKE ONE TABLET BY MOUTH AT BEDTIME AS NEEDED FOR SLEEP OR ANXIETY  . HYDROcodone-acetaminophen (NORCO/VICODIN) 5-325 MG per tablet Take 1 tablet by mouth 2 (two) times daily as needed. May refill on or after March 16 2014  . methocarbamol (ROBAXIN) 750 MG tablet TAKE 1 TABLET BY MOUTH 3 TIMES A DAY AS NEEDED FOR MUSCLE SPASMS  . pregabalin (LYRICA) 100 MG capsule Take 1 capsule (100 mg total) by mouth 3 (three) times daily.  . sertraline (ZOLOFT) 100 MG tablet TAKE 1 TABLET BY MOUTH ONCE A DAY  . traMADol (ULTRAM) 50 MG tablet TAKE TWO TABLETS BY MOUTH EVERY 6 HOURS AS NEEDED FOR PAIN  . [DISCONTINUED] HYDROcodone-acetaminophen (NORCO/VICODIN) 5-325 MG per tablet Take 1 tablet by mouth 2 (two) times daily as needed. May refill on or after October 28  . [DISCONTINUED] pregabalin (LYRICA) 75 MG capsule Take 1 capsule (75 mg total) by mouth 2 (two) times daily.     Orders Placed This Encounter  Procedures   . MR Thoracic Spine Wo Contrast  . DG Ankle Complete Right    No Follow-up on file.

## 2014-02-23 ENCOUNTER — Telehealth: Payer: Self-pay | Admitting: Internal Medicine

## 2014-02-23 NOTE — Telephone Encounter (Signed)
emmi emailed °

## 2014-02-25 ENCOUNTER — Encounter: Payer: Self-pay | Admitting: Internal Medicine

## 2014-02-25 DIAGNOSIS — S96911A Strain of unspecified muscle and tendon at ankle and foot level, right foot, initial encounter: Secondary | ICD-10-CM | POA: Insufficient documentation

## 2014-02-25 NOTE — Assessment & Plan Note (Signed)
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  Plain films and sports medicine referral made.

## 2014-03-09 ENCOUNTER — Ambulatory Visit: Payer: Self-pay | Admitting: Internal Medicine

## 2014-03-14 ENCOUNTER — Telehealth: Payer: Self-pay | Admitting: Internal Medicine

## 2014-03-14 NOTE — Telephone Encounter (Signed)
Patient notified and stated he will keep the appointment for 03/21/14.

## 2014-03-14 NOTE — Telephone Encounter (Signed)
His thoracic spine MRI shows no significant disk protrusion, foraminal or canal stenosis.  Since both MRIs really dont' look that bad, I am at a loss as to what is causing him symptoms and would like him to see Neurology   . He has been rescheduled and should keep the appt

## 2014-03-23 ENCOUNTER — Telehealth: Payer: Self-pay | Admitting: Internal Medicine

## 2014-03-23 DIAGNOSIS — M792 Neuralgia and neuritis, unspecified: Secondary | ICD-10-CM | POA: Insufficient documentation

## 2014-03-23 NOTE — Telephone Encounter (Signed)
Office notes from last 2 Office visits [facility] as requested.

## 2014-04-11 ENCOUNTER — Encounter: Payer: Self-pay | Admitting: Internal Medicine

## 2014-04-17 ENCOUNTER — Other Ambulatory Visit: Payer: Self-pay | Admitting: Internal Medicine

## 2014-04-17 MED ORDER — HYDROCODONE-ACETAMINOPHEN 5-325 MG PO TABS: 1.0000 | ORAL_TABLET | Freq: Two times a day (BID) | ORAL | Status: DC | PRN

## 2014-04-17 NOTE — Telephone Encounter (Signed)
Patient requesting refill on hydrocodone last fill 03/16/14 ok to fill?

## 2014-04-17 NOTE — Telephone Encounter (Signed)
Patient notified and script placed at front desk. 

## 2014-04-17 NOTE — Telephone Encounter (Signed)
Ok to refill, 

## 2014-04-21 ENCOUNTER — Other Ambulatory Visit: Payer: Self-pay | Admitting: Internal Medicine

## 2014-04-21 NOTE — Telephone Encounter (Signed)
Script faxed.

## 2014-05-09 ENCOUNTER — Other Ambulatory Visit: Payer: Self-pay | Admitting: *Deleted

## 2014-05-09 NOTE — Telephone Encounter (Signed)
Last visit 02/22/14, ok refill?

## 2014-05-10 MED ORDER — ALPRAZOLAM 1 MG PO TABS: ORAL_TABLET | ORAL | Status: DC

## 2014-05-10 MED ORDER — PREGABALIN 100 MG PO CAPS: 100.0000 mg | ORAL_CAPSULE | Freq: Three times a day (TID) | ORAL | Status: DC

## 2014-05-10 NOTE — Telephone Encounter (Signed)
Ok to refill,  printed rx  

## 2014-05-10 NOTE — Telephone Encounter (Signed)
Faxed to pharmacy

## 2014-05-11 ENCOUNTER — Telehealth: Payer: Self-pay | Admitting: *Deleted

## 2014-05-11 NOTE — Telephone Encounter (Signed)
Faxed to [facility]

## 2014-05-11 NOTE — Telephone Encounter (Signed)
[facility], needing PA for Lyrica. PA started, placed in Dr. Melina Schoolsullo's folder for completion.

## 2014-05-11 NOTE — Telephone Encounter (Addendum)
Do you still have the Lyrica script and alprazolam they were suppose to go to [facility] patient called to advise he only uses [facility] for tramadol.

## 2014-05-12 ENCOUNTER — Other Ambulatory Visit: Payer: Self-pay | Admitting: Internal Medicine

## 2014-05-12 NOTE — Telephone Encounter (Signed)
[facility]  [id number]

## 2014-05-12 NOTE — Telephone Encounter (Signed)
May refill on or after march 12 rx printed

## 2014-05-12 NOTE — Telephone Encounter (Signed)
Last refill 2.12.16, last OV 12.16.15.  Please advise refill

## 2014-05-15 ENCOUNTER — Telehealth: Payer: Self-pay | Admitting: Internal Medicine

## 2014-05-15 ENCOUNTER — Other Ambulatory Visit: Payer: Self-pay | Admitting: Internal Medicine

## 2014-05-15 MED ORDER — HYDROCODONE-ACETAMINOPHEN 5-325 MG PO TABS: 1.0000 | ORAL_TABLET | Freq: Two times a day (BID) | ORAL | Status: DC | PRN

## 2014-05-15 NOTE — Telephone Encounter (Signed)
Ok to refill,  printed rx   He is not B12 deficient so the injections will be out of pocket , not covered by insurance

## 2014-05-15 NOTE — Telephone Encounter (Signed)
Pt asked about starting B12 shots again. Please advise/msn

## 2014-05-15 NOTE — Telephone Encounter (Signed)
Patient requesting refill on Hydrocodone ok to fill? Last fill 04/17/14. Patient also Ask if he could return to getting  B-12 injections?

## 2014-05-15 NOTE — Telephone Encounter (Signed)
B 12 injection or sublingual ?

## 2014-05-16 ENCOUNTER — Other Ambulatory Visit: Payer: Self-pay | Admitting: Internal Medicine

## 2014-05-16 MED ORDER — CYANOCOBALAMIN 1000 MCG SL SUBL: 1.0000 | SUBLINGUAL_TABLET | Freq: Every day | SUBLINGUAL | Status: AC

## 2014-05-16 NOTE — Telephone Encounter (Signed)
Patient notified and voiced understanding.

## 2014-05-16 NOTE — Telephone Encounter (Signed)
Sublingual  He is not b12 deficient so insurance will not pay for injections

## 2014-05-16 NOTE — Telephone Encounter (Signed)
Patient aware refill ready and picked up.

## 2014-06-12 ENCOUNTER — Other Ambulatory Visit: Payer: Self-pay | Admitting: Internal Medicine

## 2014-06-12 NOTE — Telephone Encounter (Signed)
Last OV 12.16.15, last refill 3.12.16.  Please advise refill

## 2014-06-13 NOTE — Telephone Encounter (Signed)
Ok to refill,  printed rx  

## 2014-06-13 NOTE — Telephone Encounter (Signed)
Rx faxed

## 2014-06-14 ENCOUNTER — Other Ambulatory Visit: Payer: Self-pay | Admitting: Internal Medicine

## 2014-06-14 MED ORDER — HYDROCODONE-ACETAMINOPHEN 5-325 MG PO TABS: 1.0000 | ORAL_TABLET | Freq: Two times a day (BID) | ORAL | Status: DC | PRN

## 2014-06-14 NOTE — Telephone Encounter (Signed)
Ok to refill,  printed rx  

## 2014-06-14 NOTE — Telephone Encounter (Signed)
Spoke with pt, advised Rx ready for pick up 

## 2014-06-14 NOTE — Telephone Encounter (Signed)
Patient called for refill on Norco ok to fill? Last fill 05/15/14

## 2014-07-04 ENCOUNTER — Other Ambulatory Visit: Payer: Self-pay | Admitting: Internal Medicine

## 2014-07-06 ENCOUNTER — Other Ambulatory Visit: Payer: Self-pay | Admitting: Internal Medicine

## 2014-07-06 NOTE — Telephone Encounter (Signed)
Rx faxed

## 2014-07-06 NOTE — Telephone Encounter (Signed)
Last OV 12.16.15, last refill 3.2.16.  please advise refill

## 2014-07-06 NOTE — Telephone Encounter (Signed)
Last refill 4.5.16 #180, last OV 12.16.15.  Please advise refill

## 2014-07-06 NOTE — Telephone Encounter (Signed)
Ok to refill,  printed rx  

## 2014-07-08 ENCOUNTER — Ambulatory Visit
Admit: 2014-07-08 | Disposition: A | Discharge status: Home / Self Care | Payer: Self-pay | Attending: Neurology | Admitting: Neurology

## 2014-07-14 ENCOUNTER — Telehealth: Payer: Self-pay | Admitting: Internal Medicine

## 2014-07-14 ENCOUNTER — Other Ambulatory Visit: Payer: Self-pay

## 2014-07-14 NOTE — Telephone Encounter (Signed)
The refill on vicodin is denied, because his MRI of spine was normal, and he no showed his referral to Neurology in January.  I will continue to refill the tramadol until his next appt. But I cannot fill vicodin.

## 2014-07-14 NOTE — Telephone Encounter (Signed)
Tried to call patient no answer. No voicemail.

## 2014-07-14 NOTE — Telephone Encounter (Signed)
The pt called and is hoping to get a refill of his Norco medication.   Callback - [phone]

## 2014-07-14 NOTE — Telephone Encounter (Signed)
Please find out who he has seen so we can obtain records

## 2014-07-14 NOTE — Telephone Encounter (Signed)
Last Fill 06/15/14 ok to fill? Pended and dated script.

## 2014-07-14 NOTE — Telephone Encounter (Signed)
Patient stated he has been to Neurology four times he has not missed an appointment. Patient to discuss at next visit.

## 2014-07-17 NOTE — Telephone Encounter (Signed)
Patient stated he saw Dr. Malvin JohnsPotter x 2 weeks ago.

## 2014-07-17 NOTE — Telephone Encounter (Signed)
Patient stated he would get records from Dr. Malvin JohnsPotter.

## 2014-07-21 ENCOUNTER — Telehealth: Payer: Self-pay | Admitting: *Deleted

## 2014-07-21 NOTE — Telephone Encounter (Signed)
Pt left VM requesting status of medical records and Vicodin Rx. Called pt back, notified I had sent request to Dr. Daisy BlossomPotter's office to get most recent office notes and that we would notify once Dr. Darrick Huntsmanullo has reviewed them. FYI

## 2014-07-21 NOTE — Telephone Encounter (Signed)
PA for Lyrica faxed to [facility].

## 2014-07-26 ENCOUNTER — Telehealth: Payer: Self-pay | Admitting: Internal Medicine

## 2014-07-26 ENCOUNTER — Other Ambulatory Visit: Payer: Self-pay | Admitting: Internal Medicine

## 2014-07-26 NOTE — Telephone Encounter (Signed)
Okay to refill? Last seen on 02/22/14. (see note from 07/14/14: The refill on vicodin is denied, because his MRI of spine was normal, and he no showed his referral to Neurology in January. I will continue to refill the tramadol until his next appt. But I cannot fill vicodin.)

## 2014-07-26 NOTE — Telephone Encounter (Signed)
Patient called again asking for Norco have requested records from dr. Malvin JohnsPotter office.

## 2014-07-27 NOTE — Telephone Encounter (Signed)
Ok to refill,  Authorized in epic 

## 2014-07-27 NOTE — Telephone Encounter (Signed)
Rx faxed by Kathy 

## 2014-08-21 ENCOUNTER — Other Ambulatory Visit: Payer: Self-pay | Admitting: Internal Medicine

## 2014-08-21 NOTE — Telephone Encounter (Signed)
30 day supply authorized andprinted 

## 2014-08-21 NOTE — Telephone Encounter (Signed)
Last visit 02/22/14, last refill 07/27/14 #180, ok refill? Called pt and scheduled 6 month follow up visit for 08/29/14 with Dr. Darrick Huntsman.    While on the phone, pt complaining persistent sore throat and generalized not feeling well. Scheduled acute appt with Lyla Son for 08/22/14, advised appt would be for acute symptoms only,  verbalized understanding

## 2014-08-21 NOTE — Telephone Encounter (Signed)
Rx phoned into pharmacy.

## 2014-08-22 ENCOUNTER — Ambulatory Visit (INDEPENDENT_AMBULATORY_CARE_PROVIDER_SITE_OTHER): Payer: Self-pay | Admitting: Nurse Practitioner

## 2014-08-22 ENCOUNTER — Encounter: Payer: Self-pay | Admitting: Nurse Practitioner

## 2014-08-22 VITALS — BP 126/70 | HR 84 | Temp 98.3°F | Resp 12 | Ht 68.75 in | Wt 125.8 lb

## 2014-08-22 DIAGNOSIS — J069 Acute upper respiratory infection, unspecified: Secondary | ICD-10-CM

## 2014-08-22 DIAGNOSIS — B9789 Other viral agents as the cause of diseases classified elsewhere: Principal | ICD-10-CM

## 2014-08-22 MED ORDER — GUAIFENESIN-CODEINE 100-10 MG/5ML PO SYRP
5.0000 mL | ORAL_SOLUTION | Freq: Three times a day (TID) | ORAL | Status: DC | PRN
Start: 2014-08-22 — End: 2014-09-21

## 2014-08-22 MED ORDER — VARENICLINE TARTRATE 0.5 MG X 11 & 1 MG X 42 PO MISC: ORAL | Status: DC

## 2014-08-22 NOTE — Progress Notes (Signed)
Pre visit review using our clinic review tool, if applicable. No additional management support is needed unless otherwise documented below in the visit note. 

## 2014-08-22 NOTE — Progress Notes (Signed)
   Subjective:    Patient ID: Corey Shaw, male    DOB: 05-Dec-1980, 34 y.o.   MRN: [id number]  HPI  [name] is a 34 yo male with a CC of sore throat and medication management.   1)  X 5 days cough-dry and congestion, sore throat  Advil Cold and sinus- not helpful  Denies sick contacts   Review of Systems  Constitutional: Negative for fever, chills, diaphoresis and fatigue.  HENT: Positive for sore throat. Negative for ear discharge, ear pain, nosebleeds, postnasal drip, rhinorrhea, tinnitus and trouble swallowing.   Respiratory: Positive for cough. Negative for chest tightness, shortness of breath and wheezing.   Cardiovascular: Negative for chest pain, palpitations and leg swelling.  Gastrointestinal: Negative for nausea, vomiting and diarrhea.  Skin: Negative for rash.      Objective:   Physical Exam  Constitutional: He is oriented to person, place, and time. He appears well-developed and well-nourished. No distress.  BP 126/70 mmHg  Pulse 84  Temp(Src) 98.3 F (36.8 C) (Oral)  Resp 12  Ht 5' 8.75" (1.746 m)  Wt 125 lb 12.8 oz (57.063 kg)  BMI 18.72 kg/m2  SpO2 97%   HENT:  Head: Normocephalic and atraumatic.  Right Ear: External ear normal.  Left Ear: External ear normal.  Mouth/Throat: No oropharyngeal exudate.  Top of mouth- dentures  Eyes: EOM are normal. Pupils are equal, round, and reactive to light. Right eye exhibits no discharge. Left eye exhibits no discharge. No scleral icterus.  Neck: Normal range of motion. Neck supple. No thyromegaly present.  Cardiovascular: Normal rate, regular rhythm, normal heart sounds and intact distal pulses.  Exam reveals no gallop and no friction rub.   No murmur heard. Pulmonary/Chest: Effort normal and breath sounds normal. No respiratory distress. He has no wheezes. He has no rales. He exhibits no tenderness.  Lymphadenopathy:    He has no cervical adenopathy.  Neurological: He is alert and oriented to person, place,  and time.  Skin: Skin is warm and dry. No rash noted. He is not diaphoretic.  Psychiatric: He has a normal mood and affect. His behavior is normal. Judgment and thought content normal.      Assessment & Plan:

## 2014-08-22 NOTE — Patient Instructions (Signed)
The cough syrup will make you sleepy- take at bedtime or if at home (not driving or operating machinery).   Call us if next week no improvement  Okay to continue Advil cold/sinus

## 2014-08-25 ENCOUNTER — Ambulatory Visit: Payer: Medicaid Other | Admitting: Nurse Practitioner

## 2014-08-25 ENCOUNTER — Other Ambulatory Visit: Payer: Self-pay | Admitting: Nurse Practitioner

## 2014-08-25 ENCOUNTER — Ambulatory Visit: Payer: Medicaid Other | Admitting: Internal Medicine

## 2014-08-25 ENCOUNTER — Telehealth: Payer: Self-pay

## 2014-08-25 MED ORDER — AMOXICILLIN-POT CLAVULANATE 875-125 MG PO TABS: 1.0000 | ORAL_TABLET | Freq: Two times a day (BID) | ORAL | Status: DC

## 2014-08-25 NOTE — Telephone Encounter (Signed)
[facility] did not see this patient due to his insurance ([employer])  - Bonita Quin from [facility] was hoping that C.Doss or Dr.Tullo could work him in today for his possible ear infection.

## 2014-08-25 NOTE — Telephone Encounter (Signed)
Spoke with pt, pcp is calling in medication to pharmacy (pt was seen recently with cold symptoms)

## 2014-08-25 NOTE — Telephone Encounter (Signed)
Thanks

## 2014-08-26 DIAGNOSIS — J069 Acute upper respiratory infection, unspecified: Secondary | ICD-10-CM | POA: Insufficient documentation

## 2014-08-26 DIAGNOSIS — B9789 Other viral agents as the cause of diseases classified elsewhere: Principal | ICD-10-CM

## 2014-08-26 NOTE — Assessment & Plan Note (Signed)
Robitussin AC script given to pt. Augmentin called in (later called with ear pain also and worsening symptoms x 7 days). FU prn worsening/failure to improve.

## 2014-08-29 ENCOUNTER — Encounter: Payer: Self-pay | Admitting: Internal Medicine

## 2014-08-29 ENCOUNTER — Ambulatory Visit (INDEPENDENT_AMBULATORY_CARE_PROVIDER_SITE_OTHER): Payer: Self-pay | Admitting: Internal Medicine

## 2014-08-29 VITALS — BP 138/82 | HR 88 | Temp 98.2°F | Resp 14 | Ht 69.0 in | Wt 123.4 lb

## 2014-08-29 DIAGNOSIS — M5431 Sciatica, right side: Secondary | ICD-10-CM

## 2014-08-29 DIAGNOSIS — H6501 Acute serous otitis media, right ear: Secondary | ICD-10-CM

## 2014-08-29 DIAGNOSIS — H9192 Unspecified hearing loss, left ear: Secondary | ICD-10-CM

## 2014-08-29 DIAGNOSIS — R7301 Impaired fasting glucose: Secondary | ICD-10-CM

## 2014-08-29 MED ORDER — PREDNISONE 10 MG PO TABS: ORAL_TABLET | ORAL | Status: DC

## 2014-08-29 MED ORDER — TRAMADOL HCL 50 MG PO TABS: 50.0000 mg | ORAL_TABLET | Freq: Four times a day (QID) | ORAL | Status: DC | PRN

## 2014-08-29 MED ORDER — HYDROXYZINE HCL 25 MG PO TABS: 25.0000 mg | ORAL_TABLET | Freq: Three times a day (TID) | ORAL | Status: DC | PRN

## 2014-08-29 NOTE — Progress Notes (Signed)
Pre-visit discussion using our clinic review tool. No additional management support is needed unless otherwise documented below in the visit note.  

## 2014-08-29 NOTE — Patient Instructions (Addendum)
You may have recently ruptured your right eardrum, which may be why you have lost hearing on that side  I am prescribing prednisone taper and advise you to use Sudafed OE 10 mg every 6 hours to managed the pressure in your ear  Referral to ENT also in process   I have also prescribed hydroxyzine for our itching,  It may help you rest at night (you can take 50 mg if needed)  Please return for fasting labs soon
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
## 2014-08-31 ENCOUNTER — Other Ambulatory Visit: Payer: Self-pay | Admitting: Internal Medicine

## 2014-08-31 DIAGNOSIS — H6690 Otitis media, unspecified, unspecified ear: Secondary | ICD-10-CM | POA: Insufficient documentation

## 2014-08-31 NOTE — Assessment & Plan Note (Signed)
Annual hemoglobin A1c has been consistently less than 6.0 . Patient is up-to-date on foot exams and eye exam was advised.  Lab Results  Component Value Date   HGBA1C 5.9 06/01/2013

## 2014-08-31 NOTE — Assessment & Plan Note (Signed)
referring to ENT due to loss of hearing in the setting of recetn history suggestive of TM rupture.

## 2014-08-31 NOTE — Assessment & Plan Note (Signed)
Continue tramadol and lyrica.  vicodin discontinued per physician preference given minimal disease on lumbar spine MRI.

## 2014-09-01 NOTE — Telephone Encounter (Signed)
Last OV 6.2.16, next OV 12.21.16. Advise refill.

## 2014-09-04 NOTE — Telephone Encounter (Signed)
Ok to refill,  Authorized in [employer]. Please call in

## 2014-09-06 NOTE — Telephone Encounter (Signed)
Rx called in to [facility]

## 2014-09-07 ENCOUNTER — Telehealth: Payer: Self-pay | Admitting: Internal Medicine

## 2014-09-07 NOTE — Telephone Encounter (Signed)
Marchelle FolksAmanda called from [facility] concerning patient referral Patient has different primary listed on Medicaid card so they cannot schedule patient until have referral from Primary listed on Medicaid card. Please advise.

## 2014-09-07 NOTE — Telephone Encounter (Signed)
Please notify patient.  There is nothing I can do

## 2014-09-08 NOTE — Telephone Encounter (Signed)
Notified patient and he stated he will call and change provider to Dr. Darrick Huntsmanullo that he was unaware of this that he did not know whom it could be will call office when it has been changed.

## 2014-09-18 ENCOUNTER — Other Ambulatory Visit: Payer: Self-pay | Admitting: Nurse Practitioner

## 2014-09-18 NOTE — Telephone Encounter (Signed)
Last OV 6.21.16.  Please advise refill

## 2014-09-19 MED ORDER — VARENICLINE TARTRATE 1 MG PO TABS: 1.0000 mg | ORAL_TABLET | Freq: Two times a day (BID) | ORAL | Status: DC

## 2014-09-19 NOTE — Telephone Encounter (Signed)
Rx faxed

## 2014-09-19 NOTE — Telephone Encounter (Signed)
This is a request for a refill on the starter pack of chantix.  PLEASE CALL PATIENT BACK AND ASK HIM ASAP IF HE STARTING OVER OR NEEDS THE MAINTENANCE DOSE

## 2014-09-19 NOTE — Telephone Encounter (Signed)
Spoke with pt, he needs the maintenance dose

## 2014-09-19 NOTE — Telephone Encounter (Signed)
Thank you,  Refill changed

## 2014-09-21 ENCOUNTER — Ambulatory Visit (INDEPENDENT_AMBULATORY_CARE_PROVIDER_SITE_OTHER): Payer: Medicaid Other | Admitting: Internal Medicine

## 2014-09-21 ENCOUNTER — Encounter: Payer: Self-pay | Admitting: Internal Medicine

## 2014-09-21 VITALS — BP 124/84 | HR 81 | Temp 97.7°F | Resp 14 | Ht 69.0 in | Wt 124.8 lb

## 2014-09-21 DIAGNOSIS — R200 Anesthesia of skin: Secondary | ICD-10-CM

## 2014-09-21 DIAGNOSIS — R202 Paresthesia of skin: Secondary | ICD-10-CM

## 2014-09-21 DIAGNOSIS — F411 Generalized anxiety disorder: Secondary | ICD-10-CM

## 2014-09-21 DIAGNOSIS — G4700 Insomnia, unspecified: Secondary | ICD-10-CM

## 2014-09-21 DIAGNOSIS — M5431 Sciatica, right side: Secondary | ICD-10-CM

## 2014-09-21 DIAGNOSIS — R7301 Impaired fasting glucose: Secondary | ICD-10-CM

## 2014-09-21 MED ORDER — MIRTAZAPINE 15 MG PO TABS: 15.0000 mg | ORAL_TABLET | Freq: Every day | ORAL | Status: DC

## 2014-09-21 MED ORDER — TRAMADOL HCL 50 MG PO TABS: 100.0000 mg | ORAL_TABLET | Freq: Four times a day (QID) | ORAL | Status: DC | PRN

## 2014-09-21 MED ORDER — CLONAZEPAM 1 MG PO TABS: 1.0000 mg | ORAL_TABLET | Freq: Two times a day (BID) | ORAL | Status: DC | PRN
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
## 2014-09-21 NOTE — Patient Instructions (Addendum)
I am changing your alprazolam to a safer sedating medication calld clonazepam ,  You can start with 1/2 tablet  ,  And take max dose of 1 tablet twice daily  Adding remeron at bedtime for appetite  And insomnia  New rx for tramadol to reflect  Need for 2 tablets every  6 hours, maximal dose is 6 tablets daily    Use Mychart to let me know how you are doing    Premier Protein and Muscle Milk both have more protein and more calories than Special K

## 2014-09-24 NOTE — Assessment & Plan Note (Signed)
Changing alprazolam to clonazepam given twice daily use lately.

## 2014-09-24 NOTE — Assessment & Plan Note (Addendum)
Etiology unclear. fiven normla MRI April 2016.  Mild paracenral disk hernation at L5-S1 on the LEFT.  Pain clinic referral is underway. rx for tramadol was written incorrectly last time and he was not ablel to take the previously allowed maximum amount.  rx rewritten today

## 2014-09-24 NOTE — Assessment & Plan Note (Signed)
Etiology unclear, given MRI of lumbar spine April 2016 showing no nerve root displacement or compression.  Awaiting results of neurology's evaluation , [facility].

## 2014-09-24 NOTE — Assessment & Plan Note (Signed)
Last A1c had been consistently less than 6.0 .   Lab Results  Component Value Date   HGBA1C 5.9 06/01/2013

## 2014-09-24 NOTE — Assessment & Plan Note (Signed)
Aggravated by wife's estrangaement,  Accompanied by loss of appetite.  remeron and clonazepam prescribed,

## 2014-09-26 ENCOUNTER — Telehealth: Payer: Self-pay | Admitting: *Deleted

## 2014-09-26 ENCOUNTER — Encounter: Payer: Self-pay | Admitting: Internal Medicine

## 2014-09-26 MED ORDER — CIPROFLOXACIN HCL 0.3 % OP SOLN: OPHTHALMIC | Status: DC

## 2014-09-26 NOTE — Telephone Encounter (Signed)
Pt called states yesterday he began having pain in his right eye and now he is having matting and blurred vision.  I attempted to advise an appoint for eval.  Pt declined states he knows Dr Darrick Huntsmanullo personally and he knows she will prescribe drops for him.  Pt denies allergic reaction to eye drops, however has allergy to sulfa drugs.  Please advise

## 2014-09-26 NOTE — Telephone Encounter (Signed)
Antibiotic ointment sent to his pharmacy to use in right eye for 5 days   If no improvement will need to be seen by eye doctor because it may be a corneal abrasion or something else.

## 2014-09-27 NOTE — Telephone Encounter (Signed)
Left detailed message on VM.

## 2014-10-06 ENCOUNTER — Telehealth: Payer: Self-pay

## 2014-10-06 MED ORDER — TRAMADOL HCL 50 MG PO TABS: 100.0000 mg | ORAL_TABLET | Freq: Four times a day (QID) | ORAL | Status: DC | PRN

## 2014-10-06 NOTE — Telephone Encounter (Signed)
Pt returned called to the office.  Please call pt back.

## 2014-10-06 NOTE — Telephone Encounter (Signed)
Left message for patient to call office.  

## 2014-10-06 NOTE — Telephone Encounter (Signed)
Patient left a message stating that he has accidently thrown away his tramadol prescription. He said that he has not had a chance to get it filled, and he was cleaning out his car, and he must of thrown away the prescription. Patient wants to know if he can have another prescription written.

## 2014-10-06 NOTE — Telephone Encounter (Signed)
I will authorize refill this one time as long as you confirm with the pharmacy that is has not been filled.

## 2014-10-06 NOTE — Telephone Encounter (Signed)
I'm still not sure what was filled.  Can you clarify?  These last two messages suggest that HE filled the one for #120 on July 13th,  And someone else filled one (the one he lost?) for 180 on July 21?

## 2014-10-06 NOTE — Telephone Encounter (Signed)
Please tear up the rx I wrote and inform patient that I will no longer refill his tramadol because the prescription was filled on July 21st.  Please have Kenney Houseman or Harriett Sine draft the contract violation letter for me to sign.

## 2014-10-06 NOTE — Telephone Encounter (Signed)
Patient insists that he did not fill this script that he accidentally threw it in the trash when he cleaned out his car. Due to patient persistence he did not fill script i called his pharmacy and the only script they filled was [facility] on [facility]/20/14 for 120 tablets. Narcotic's control system shows 180 filled  On 09/28/14.

## 2014-10-06 NOTE — Telephone Encounter (Addendum)
I had carrie look again to be sure because patient insist he did not fill this script and i called his pharmacy and he has not filled anything but the script on 09/20/14 and Lyla Son re look at this script and it was filled 08/21/14 for 120 pills. She was mistaken it was 120 on 09/20/14 not 180. Patient is aware that i went back and looked and found the medication filled was 09/20/14 for 120.

## 2014-10-06 NOTE — Telephone Encounter (Signed)
Corey Shaw looked on the controlled substance sight script was filled 09/28/14 written 6/31/16, I have the script you printed. I was going to check out before sent to you i know you are out of the office,.

## 2014-10-09 ENCOUNTER — Other Ambulatory Visit: Payer: Self-pay | Admitting: Internal Medicine

## 2014-10-09 MED ORDER — TRAMADOL HCL 50 MG PO TABS: 100.0000 mg | ORAL_TABLET | Freq: Four times a day (QID) | ORAL | Status: DC | PRN

## 2014-10-09 NOTE — Addendum Note (Signed)
Addended by: Henrene Pastor R on: 10/09/2014 11:50 AM   Modules accepted: Orders

## 2014-10-09 NOTE — Telephone Encounter (Signed)
OK THEN IF THE #120 WAS FILLED ON 7/13 AND HE TAKES 6 DAILY HE WILL BE OUT THIS WEEK SO YOU CAN GIVE HIM THE NEW ONE FOR #180/MPONTH IF YO STILL HAVE IT

## 2014-10-09 NOTE — Telephone Encounter (Signed)
No I checked further and the 1 for 180 had not been filled as by the controlled substance for [place].

## 2014-10-09 NOTE — Telephone Encounter (Signed)
Refill printed 

## 2014-10-09 NOTE — Telephone Encounter (Signed)
rx faxed

## 2014-10-12 ENCOUNTER — Telehealth: Payer: Self-pay

## 2014-10-12 NOTE — Telephone Encounter (Signed)
Patient left a message stating that he is still having issues with the pharmacy filling his prescription. He says that the pharmacy will not fill it, and he was wondering what he needs to do. He is requesting a call back at [phone].

## 2014-10-13 NOTE — Telephone Encounter (Signed)
Patient called in again about his Tramadol and the Pharmacy is still not going to fill it based on the pharmacy thinking it is too early to fill Pt is wondering if the Dr. Darrick Huntsman can send him something until he can fill his RX on Tuesday. He has been with out his medicine since Monday. Please Advice MD.

## 2014-10-13 NOTE — Telephone Encounter (Signed)
I tto k care of this called pharamcy and they will have ready in 2 hours patient notified.

## 2014-10-13 NOTE — Telephone Encounter (Signed)
Dr. Darrick Huntsman, please advise?

## 2014-10-31 ENCOUNTER — Other Ambulatory Visit: Payer: Self-pay | Admitting: Internal Medicine

## 2014-10-31 NOTE — Telephone Encounter (Signed)
Last OV 7.14.16.  Please advise refill 

## 2014-11-01 NOTE — Telephone Encounter (Signed)
Again,  With the zoloft refill request?  Why was my input required?

## 2014-11-02 NOTE — Telephone Encounter (Signed)
Rx faxed

## 2014-11-09 ENCOUNTER — Telehealth: Payer: Self-pay | Admitting: *Deleted

## 2014-11-09 MED ORDER — ALPRAZOLAM 0.5 MG PO TABS: 0.5000 mg | ORAL_TABLET | Freq: Two times a day (BID) | ORAL | Status: DC | PRN

## 2014-11-09 NOTE — Telephone Encounter (Signed)
Pt called states the clonazepam is causing blurred vision.  Pt is requesting a Rx change.  Please advise

## 2014-11-09 NOTE — Telephone Encounter (Signed)
He just refilled the clonazepam on august 24th.  If he surrenders the clonazepam,  he can resume alprazolam maximum twice daily.   rx printed

## 2014-11-10 ENCOUNTER — Telehealth: Payer: Self-pay | Admitting: *Deleted

## 2014-11-10 NOTE — Telephone Encounter (Signed)
Pt surrendered that Clonazepam Rx.  Cherlynn Kaiser, RN and I verified 43 remaining tablets. Pt given Alprazolam Rx.

## 2014-11-10 NOTE — Telephone Encounter (Signed)
Spoke with pt, advised he would need to bring in Clonazepam Rx in order to pick up Alprazolam Rx.  Pt verbalized understanding.

## 2014-12-29 ENCOUNTER — Other Ambulatory Visit: Payer: Self-pay | Admitting: Internal Medicine

## 2014-12-29 NOTE — Telephone Encounter (Signed)
Last filled on 07/06/14, Last OV on 09/21/14... Okay to refill?

## 2015-01-01 NOTE — Telephone Encounter (Signed)
Refill ,  rx printed

## 2015-01-02 NOTE — Telephone Encounter (Signed)
Refill faxed

## 2015-02-03 ENCOUNTER — Other Ambulatory Visit: Payer: Self-pay | Admitting: Internal Medicine

## 2015-02-19 ENCOUNTER — Other Ambulatory Visit: Payer: Self-pay | Admitting: Internal Medicine

## 2015-02-19 NOTE — Telephone Encounter (Signed)
Ok to fill 

## 2015-02-20 NOTE — Telephone Encounter (Signed)
Ok to refill,  printed rx .  NEEDS 6 MONTH FOLLOW UP SCHEDULED BY END OF January

## 2015-02-20 NOTE — Telephone Encounter (Signed)
Appointment scheduled 02/28/15

## 2015-02-28 ENCOUNTER — Ambulatory Visit (INDEPENDENT_AMBULATORY_CARE_PROVIDER_SITE_OTHER): Payer: Medicaid Other | Admitting: Internal Medicine

## 2015-02-28 DIAGNOSIS — M5431 Sciatica, right side: Secondary | ICD-10-CM

## 2015-03-02 NOTE — Assessment & Plan Note (Signed)
Patient failed to keep scheduled appointment and will be charged a no show fee.   

## 2015-03-02 NOTE — Progress Notes (Signed)
Patient failed to keep scheduled appointment and will be charged a no show fee.   

## 2015-03-19 ENCOUNTER — Other Ambulatory Visit: Payer: Self-pay | Admitting: Internal Medicine

## 2015-03-19 NOTE — Telephone Encounter (Signed)
Please advise refill? 

## 2015-03-20 NOTE — Telephone Encounter (Signed)
Ok to refill,  printed rx  

## 2015-03-21 NOTE — Telephone Encounter (Signed)
Script faxed as requested.

## 2015-05-04 ENCOUNTER — Encounter: Payer: Self-pay | Admitting: Family Medicine

## 2015-05-04 ENCOUNTER — Ambulatory Visit (INDEPENDENT_AMBULATORY_CARE_PROVIDER_SITE_OTHER): Payer: Self-pay | Admitting: Family Medicine

## 2015-05-04 VITALS — BP 128/78 | HR 99 | Temp 98.5°F | Ht 69.0 in | Wt 125.4 lb

## 2015-05-04 DIAGNOSIS — M5431 Sciatica, right side: Secondary | ICD-10-CM

## 2015-05-04 DIAGNOSIS — F411 Generalized anxiety disorder: Secondary | ICD-10-CM

## 2015-05-04 MED ORDER — TRAMADOL HCL 50 MG PO TABS: 100.0000 mg | ORAL_TABLET | Freq: Four times a day (QID) | ORAL | Status: DC | PRN

## 2015-05-04 MED ORDER — BUSPIRONE HCL 7.5 MG PO TABS: 7.5000 mg | ORAL_TABLET | Freq: Two times a day (BID) | ORAL | Status: DC

## 2015-05-04 NOTE — Assessment & Plan Note (Signed)
Patient with exacerbation of chronic back pain. Neurologically at baseline. No red flags. We will refill his tramadol. I advised that I would not prescribe him Vicodin or other narcotic pain medication other than tramadol. Advised that he needed to discuss this with his PCP if he needed additional pain medication. He is given return precautions.
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
## 2015-05-04 NOTE — Patient Instructions (Signed)
Nice to meet you. We will start you on BuSpar for your anxiety and depression. You should continue the Zoloft. You should only use the Klonopin as needed. We have also refilled your tramadol. You should be aware that the medications drunk can make you drowsy. If you become drowsy you need to let us know. If you develop thoughts of harming your self or others you need to seek medical attention. If you develop numbness, weakness, loss of bowel or bladder function, numbness between her legs, fevers, or any new or changing symptoms please seek medical attention.
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
## 2015-05-04 NOTE — Progress Notes (Signed)
Pre visit review using our clinic review tool, if applicable. No additional management support is needed unless otherwise documented below in the visit note. 

## 2015-05-28 ENCOUNTER — Other Ambulatory Visit: Payer: Self-pay | Admitting: Internal Medicine

## 2015-05-28 NOTE — Telephone Encounter (Signed)
Last OV 03/11/14 ok to fill? 

## 2015-05-29 NOTE — Telephone Encounter (Signed)
refilled 

## 2015-05-30 ENCOUNTER — Other Ambulatory Visit: Payer: Self-pay | Admitting: Internal Medicine

## 2015-05-30 NOTE — Telephone Encounter (Signed)
Refill faxed

## 2015-06-20 ENCOUNTER — Other Ambulatory Visit: Payer: Self-pay | Admitting: Internal Medicine

## 2015-06-20 NOTE — Telephone Encounter (Signed)
Pt called needing a refill for traMADol (ULTRAM) 50 MG tablet. Pharmacy is [id number] - [address] Call pt @ [phone]. Thank you!

## 2015-06-21 MED ORDER — TRAMADOL HCL 50 MG PO TABS: 100.0000 mg | ORAL_TABLET | Freq: Four times a day (QID) | ORAL | Status: DC | PRN

## 2015-06-21 NOTE — Telephone Encounter (Signed)
Refill authorized and printed 

## 2015-06-21 NOTE — Telephone Encounter (Signed)
Please advise refill, last filled on 05/04/15. thanks

## 2015-07-13 ENCOUNTER — Other Ambulatory Visit: Payer: Self-pay | Admitting: Internal Medicine

## 2015-07-13 NOTE — Telephone Encounter (Signed)
Alprazolam denied. Patient was given Buspar and Klonipin refill by Dr Birdie SonsSonnenberg.

## 2015-07-13 NOTE — Telephone Encounter (Signed)
Last OV with Primary 12/16 saw Dr. Birdie SonsSonnenberg 2/17 Ok to fill Alprazolam?

## 2015-08-28 ENCOUNTER — Other Ambulatory Visit: Payer: Self-pay | Admitting: Internal Medicine

## 2015-08-28 NOTE — Telephone Encounter (Signed)
Patient last saw another provider for acute issue, last OV with you was 12/16. Please advise for refill. thanks

## 2015-08-29 NOTE — Telephone Encounter (Signed)
Ok to refill,  Authorized in epic and printed  

## 2015-10-30 ENCOUNTER — Telehealth: Payer: Self-pay | Admitting: *Deleted

## 2015-10-30 NOTE — Telephone Encounter (Signed)
Please give a time and date to schedule pt for a medication consult Pt contact [phone]

## 2015-10-30 NOTE — Telephone Encounter (Signed)
Next available with Dr. Darrick Huntsmanullo would work. Unless its a CS

## 2015-10-30 NOTE — Telephone Encounter (Signed)
lvm to call office to schedule  

## 2015-11-28 ENCOUNTER — Encounter: Payer: Self-pay | Admitting: *Deleted

## 2015-11-28 ENCOUNTER — Other Ambulatory Visit: Payer: Self-pay | Admitting: Internal Medicine

## 2015-11-28 NOTE — Telephone Encounter (Signed)
Rx faxed to pharmacy  

## 2015-11-28 NOTE — Telephone Encounter (Signed)
Refill for 30 days only.  Hneeds to have OV every six months

## 2015-11-28 NOTE — Telephone Encounter (Signed)
Mychart message sent.

## 2015-11-28 NOTE — Telephone Encounter (Signed)
Last OV with PCP 12/16 last fill on tramadol 4/17 180 with 4 refills.

## 2015-12-24 ENCOUNTER — Encounter: Payer: Self-pay | Admitting: Internal Medicine

## 2015-12-24 ENCOUNTER — Ambulatory Visit (INDEPENDENT_AMBULATORY_CARE_PROVIDER_SITE_OTHER): Payer: Medicaid Other | Admitting: Internal Medicine

## 2015-12-24 VITALS — BP 128/96 | HR 80 | Temp 98.2°F | Resp 12 | Ht 69.0 in | Wt 132.0 lb

## 2015-12-24 DIAGNOSIS — F411 Generalized anxiety disorder: Secondary | ICD-10-CM

## 2015-12-24 DIAGNOSIS — F5105 Insomnia due to other mental disorder: Secondary | ICD-10-CM

## 2015-12-24 DIAGNOSIS — M5431 Sciatica, right side: Secondary | ICD-10-CM

## 2015-12-24 DIAGNOSIS — F99 Mental disorder, not otherwise specified: Secondary | ICD-10-CM

## 2015-12-24 DIAGNOSIS — R5383 Other fatigue: Secondary | ICD-10-CM

## 2015-12-24 DIAGNOSIS — E7800 Pure hypercholesterolemia, unspecified: Secondary | ICD-10-CM

## 2015-12-24 MED ORDER — GABAPENTIN 300 MG PO CAPS: 300.0000 mg | ORAL_CAPSULE | Freq: Three times a day (TID) | ORAL | 3 refills | Status: DC

## 2015-12-24 MED ORDER — CLONAZEPAM 1 MG PO TABS: ORAL_TABLET | ORAL | 5 refills | Status: DC

## 2015-12-24 NOTE — Progress Notes (Signed)
Pre-visit discussion using our clinic review tool. No additional management support is needed unless otherwise documented below in the visit note.  
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
## 2015-12-24 NOTE — Patient Instructions (Signed)
You have a viral URI .  The post nasal drip may be causing the night time cough  For the nighttime cough,  Try the following combination:    Generic benadryl (dipenhydramine,  25 mg) PLUS  Robitussin (or Equal alternative,  Contains dextromethorphan)   For your knee pain:  Try taking gabapentin 300mg   with dinner for your knee pain,  You can add a 2nd dose  If neded   continue clonazepam two times daily for anxiety ,  And remeron for depression

## 2015-12-25 ENCOUNTER — Telehealth: Payer: Self-pay | Admitting: *Deleted

## 2015-12-25 LAB — CBC WITH DIFFERENTIAL/PLATELET
BASOS ABS: 0.1 10*3/uL (ref 0.0–0.1)
Basophils Relative: 0.5 % (ref 0.0–3.0)
EOS ABS: 0.3 10*3/uL (ref 0.0–0.7)
Eosinophils Relative: 2.5 % (ref 0.0–5.0)
HCT: 36.9 % — ABNORMAL LOW (ref 39.0–52.0)
Hemoglobin: 12.7 g/dL — ABNORMAL LOW (ref 13.0–17.0)
LYMPHS ABS: 3.1 10*3/uL (ref 0.7–4.0)
Lymphocytes Relative: 29.5 % (ref 12.0–46.0)
MCHC: 34.4 g/dL (ref 30.0–36.0)
MCV: 89.5 fl (ref 78.0–100.0)
MONOS PCT: 7.6 % (ref 3.0–12.0)
Monocytes Absolute: 0.8 10*3/uL (ref 0.1–1.0)
NEUTROS PCT: 59.9 % (ref 43.0–77.0)
Neutro Abs: 6.3 10*3/uL (ref 1.4–7.7)
PLATELETS: 407 10*3/uL — AB (ref 150.0–400.0)
RBC: 4.13 Mil/uL — AB (ref 4.22–5.81)
RDW: 13.1 % (ref 11.5–15.5)
WBC: 10.5 10*3/uL (ref 4.0–10.5)

## 2015-12-25 LAB — LIPID PANEL
CHOL/HDL RATIO: 5
Cholesterol: 167 mg/dL (ref 0–200)
HDL: 32 mg/dL — ABNORMAL LOW (ref >39.00)
NONHDL: 135.33
TRIGLYCERIDES: 222 mg/dL — AB (ref 0.0–149.0)
VLDL: 44.4 mg/dL — AB (ref 0.0–40.0)

## 2015-12-25 LAB — COMPREHENSIVE METABOLIC PANEL
ALT: 8 U/L (ref 0–53)
AST: 13 U/L (ref 0–37)
Albumin: 4.5 g/dL (ref 3.5–5.2)
Alkaline Phosphatase: 92 U/L (ref 39–117)
BUN: 12 mg/dL (ref 6–23)
CALCIUM: 9.5 mg/dL (ref 8.4–10.5)
CHLORIDE: 103 meq/L (ref 96–112)
CO2: 29 meq/L (ref 19–32)
CREATININE: 0.78 mg/dL (ref 0.40–1.50)
GFR: 121 mL/min (ref >60.00)
Glucose, Bld: 77 mg/dL (ref 70–99)
POTASSIUM: 3.9 meq/L (ref 3.5–5.1)
SODIUM: 141 meq/L (ref 135–145)
Total Bilirubin: 0.2 mg/dL (ref 0.2–1.2)
Total Protein: 7 g/dL (ref 6.0–8.3)

## 2015-12-25 LAB — LDL CHOLESTEROL, DIRECT: LDL DIRECT: 113 mg/dL

## 2015-12-25 MED ORDER — TRAMADOL HCL 50 MG PO TABS: 100.0000 mg | ORAL_TABLET | Freq: Three times a day (TID) | ORAL | 5 refills | Status: DC | PRN

## 2015-12-25 MED ORDER — MIRTAZAPINE 15 MG PO TABS: ORAL_TABLET | ORAL | 2 refills | Status: DC

## 2015-12-25 NOTE — Assessment & Plan Note (Signed)
Continue clonazepam,  Alprazolam discontinued,  The risks and benefits of benzodiazepine use were discussed with patient today including excessive sedation leading to respiratory depression,  impaired thinking/driving, and addiction.  Patient was advised to avoid concurrent use with alcohol, to use medication only as needed and not to share with others  .

## 2015-12-25 NOTE — Assessment & Plan Note (Signed)
Continue remeron both for insomnia and appetite stimulation secondary to depression

## 2015-12-25 NOTE — Telephone Encounter (Signed)
Patient request refill on tramadol last fill for 180 on 11/28/15 ok to fill?

## 2015-12-25 NOTE — Assessment & Plan Note (Addendum)
Continue pain management with tramadol and lyrica/gabapentin. Refill history confirmed via [place] Controlled Substance databas, accessed by me today..Marland Kitchen

## 2015-12-25 NOTE — Telephone Encounter (Signed)
Ok to refill on oct 20th , rx printed

## 2015-12-26 NOTE — Telephone Encounter (Signed)
Patient notified

## 2015-12-27 ENCOUNTER — Encounter: Payer: Self-pay | Admitting: Internal Medicine

## 2016-01-10 NOTE — Telephone Encounter (Signed)
Mailed unread message to patient.  

## 2016-01-16 ENCOUNTER — Telehealth: Payer: Self-pay | Admitting: Internal Medicine

## 2016-01-16 NOTE — Telephone Encounter (Signed)
Do you have his results? Thanks.

## 2016-01-16 NOTE — Telephone Encounter (Signed)
Pt called asking for lab results, and if they could also be mailed to him. Thank you!  Call pt @ [phone]

## 2016-01-17 NOTE — Telephone Encounter (Signed)
See chart, e mail sent.  Unread,  Labs sent by mail,  Tell patinet to either inactivate his e mail or check it in the future

## 2016-02-27 ENCOUNTER — Other Ambulatory Visit: Payer: Self-pay | Admitting: Internal Medicine

## 2016-03-26 ENCOUNTER — Ambulatory Visit: Payer: Medicaid Other | Admitting: Internal Medicine

## 2016-04-29 ENCOUNTER — Other Ambulatory Visit: Payer: Self-pay | Admitting: Internal Medicine

## 2016-04-30 ENCOUNTER — Ambulatory Visit (INDEPENDENT_AMBULATORY_CARE_PROVIDER_SITE_OTHER): Payer: Self-pay | Admitting: Internal Medicine

## 2016-04-30 ENCOUNTER — Encounter: Payer: Self-pay | Admitting: Internal Medicine

## 2016-04-30 DIAGNOSIS — M5431 Sciatica, right side: Secondary | ICD-10-CM

## 2016-04-30 DIAGNOSIS — R7301 Impaired fasting glucose: Secondary | ICD-10-CM

## 2016-04-30 DIAGNOSIS — R200 Anesthesia of skin: Secondary | ICD-10-CM

## 2016-04-30 DIAGNOSIS — R202 Paresthesia of skin: Secondary | ICD-10-CM

## 2016-04-30 DIAGNOSIS — F99 Mental disorder, not otherwise specified: Secondary | ICD-10-CM

## 2016-04-30 DIAGNOSIS — G629 Polyneuropathy, unspecified: Secondary | ICD-10-CM

## 2016-04-30 DIAGNOSIS — F5105 Insomnia due to other mental disorder: Secondary | ICD-10-CM

## 2016-04-30 DIAGNOSIS — F411 Generalized anxiety disorder: Secondary | ICD-10-CM

## 2016-04-30 MED ORDER — ALPRAZOLAM 1 MG PO TABS: 1.0000 mg | ORAL_TABLET | Freq: Two times a day (BID) | ORAL | 0 refills | Status: DC | PRN

## 2016-04-30 MED ORDER — ALPRAZOLAM 1 MG PO TABS: 1.0000 mg | ORAL_TABLET | Freq: Every evening | ORAL | 0 refills | Status: DC | PRN

## 2016-04-30 MED ORDER — HYDROCODONE-ACETAMINOPHEN 5-325 MG PO TABS: 1.0000 | ORAL_TABLET | Freq: Four times a day (QID) | ORAL | 0 refills | Status: DC | PRN

## 2016-04-30 MED ORDER — DULOXETINE HCL 30 MG PO CPEP: 30.0000 mg | ORAL_CAPSULE | Freq: Every day | ORAL | 3 refills | Status: DC
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
## 2016-04-30 NOTE — Progress Notes (Signed)
Pre visit review using our clinic review tool, if applicable. No additional management support is needed unless otherwise documented below in the visit note. 

## 2016-04-30 NOTE — Patient Instructions (Addendum)
I want you to try taking cymbalta for your leg pain and anxiety.  start with q 30 mg dose after dinner .  You can increase the dose to to 60 mg after two weeks  During this trial DO NOT USE Tramadol,  You can use the vicodin I have prescribed todaym  up to 3 times  daily if needed for pain   Stop the clonazepam . Ok to use the  alprazolam  1/2 tablet twice daiLy IF NEEDED   Resume the remeron AT BEDTIME  since it was helping you put on weight   Let me know when you want to resume chantix   Return  in one month

## 2016-05-01 ENCOUNTER — Telehealth: Payer: Self-pay | Admitting: *Deleted

## 2016-05-01 ENCOUNTER — Encounter: Payer: Self-pay | Admitting: Internal Medicine

## 2016-05-01 NOTE — Assessment & Plan Note (Signed)
Last A1c had been consistently less than 6.0 .   Lab Results  Component Value Date   HGBA1C 5.9 06/01/2013       

## 2016-05-01 NOTE — Assessment & Plan Note (Addendum)
Normal electrodiagnostic study by Dr. Malvin JohnsPotter in 2016,  Normal thoracic and lumbar  MRI .not tolerating  gabapentin due to side effects of abdominal cramping.  Trial of cymbalta.

## 2016-05-01 NOTE — Assessment & Plan Note (Signed)
Starting cymbalta.  Resume alprazolam for prn use not more than 1 mg daily  RTC one month

## 2016-05-01 NOTE — Telephone Encounter (Signed)
[facility] been "getting a lot of narcotics and benzodiazepines" They are asking if he is being weaned off of something soon??? They are asking for the plan going forward.   Please advise if I need to do anything.

## 2016-05-01 NOTE — Telephone Encounter (Signed)
[facility]

## 2016-05-01 NOTE — Assessment & Plan Note (Signed)
MRI lumbar spine Nov 2014 failed to show nerve root compromise, only  a very shallow disk protusion at L5-S1 with an annual tear causing minimal narrowing of the the  lateral recess.  Trial of cymbalta will necessitate suspension of tramadol due to risk of serotonin syndrome.  Prn vicodin for one month only

## 2016-05-01 NOTE — Telephone Encounter (Signed)
I AM WELL AWARE OF WHAT HE IS GETTING AND HAVE ACCESSED THE [place] CONTROLLED DATABASE.  HIS MEDICATIONS WERE ADDRESSED AT YESTERDAY'S VISIT  CLONAZEPAM DC'D ALPRAZOLAM RX'D TRAMADOL SUSPENDED CYMBALTA STARTED VICODIN STARTED

## 2016-05-01 NOTE — Assessment & Plan Note (Signed)
Etiology unclear, given MRI of lumbar spine April 2016 showing no nerve root displacement or compression.  Awaiting results of neurology's evaluation , dr Potter from [facility].  

## 2016-05-01 NOTE — Assessment & Plan Note (Signed)
Advised to resume remeron

## 2016-05-19 ENCOUNTER — Other Ambulatory Visit: Payer: Self-pay | Admitting: Internal Medicine

## 2016-05-21 NOTE — Telephone Encounter (Signed)
When you don't see a med on the current list, you have to look on the historical bc it is likely there but has possibly run out or was discontinued , as in this case. The clonazepam refill is denied, because it was changed to alprazolam at his last visit. He cannot have both

## 2016-05-21 NOTE — Telephone Encounter (Signed)
Script faxed to [facility]

## 2016-05-21 NOTE — Telephone Encounter (Signed)
lov 04/30/16 Nov 05/30/16 We've never filled before please advise thanks

## 2016-05-30 ENCOUNTER — Encounter: Payer: Self-pay | Admitting: Internal Medicine

## 2016-05-30 ENCOUNTER — Ambulatory Visit (INDEPENDENT_AMBULATORY_CARE_PROVIDER_SITE_OTHER): Payer: Self-pay | Admitting: Internal Medicine

## 2016-05-30 DIAGNOSIS — M5431 Sciatica, right side: Secondary | ICD-10-CM

## 2016-05-30 DIAGNOSIS — F411 Generalized anxiety disorder: Secondary | ICD-10-CM

## 2016-05-30 MED ORDER — PREDNISONE 10 MG PO TABS: ORAL_TABLET | ORAL | 0 refills | Status: AC

## 2016-05-30 MED ORDER — ALPRAZOLAM 1 MG PO TABS: 1.0000 mg | ORAL_TABLET | Freq: Every evening | ORAL | 5 refills | Status: AC | PRN

## 2016-05-30 MED ORDER — MIRTAZAPINE 15 MG PO TABS: 22.5000 mg | ORAL_TABLET | Freq: Every day | ORAL | 3 refills | Status: AC

## 2016-05-30 NOTE — Progress Notes (Signed)
Pre visit review using our clinic review tool, if applicable. No additional management support is needed unless otherwise documented below in the visit note. 

## 2016-05-30 NOTE — Patient Instructions (Addendum)
YOU HAVE GAINED 7 LBS since the last visit!  Continue the remeron , , but I would like to  Increase the dose to 22.5 mg ,  Or 1.5 tablets daily at bedtime to help improve your energy level.    If after 2 weeks on the increased dose of remeron,  Your anxiety level is still not controlled on the 1 mg alprazolam I recommend that we add the sertraline  For managemend of anxeit  You can continue the alprazolam   I want you to Stop the vicodin and resume use of tramadol for pain control   You have a viral syndrome which is causing bronchitis.    The post nasal drip may also be contributing to  your  Cough.  I am prescribing a prednisone taper  to manage the inflammation in your bronchial tubes   I also advise use of the following OTC meds to help with your other symptoms.   Continue generic OTC benadryl 25 mg  At bedtime for the drainage,,  tessalon perles or Delsym for daytime cough. flush your sinuses twice daily with [facility] HugerNeil Meds sinus sinse
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
## 2016-06-01 NOTE — Assessment & Plan Note (Addendum)
With weight loss and insomnia.  Wt loss improved with remeron.   Will increase dose from 15 to 22.5 mg ,  Refill alprazolam.  Adding sertraline in 2-3 weeks if no improvement with remeron.

## 2016-06-01 NOTE — Assessment & Plan Note (Signed)
He was unable to afford cymbalta trial.  will resume tramadol

## 2016-09-01 ENCOUNTER — Ambulatory Visit: Payer: Medicaid Other | Admitting: Internal Medicine

## 2016-09-01 DIAGNOSIS — Z0289 Encounter for other administrative examinations: Secondary | ICD-10-CM
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
## 2020-04-03 ENCOUNTER — Encounter: Payer: Self-pay | Admitting: *Deleted

## 2023-12-31 ENCOUNTER — Ambulatory Visit
Admission: RE | Admit: 2023-12-31 | Discharge: 2023-12-31 | Disposition: A | Discharge status: Home / Self Care | Payer: MEDICAID | Source: Ambulatory Visit | Attending: Orthopedic Surgery | Admitting: Orthopedic Surgery

## 2023-12-31 ENCOUNTER — Other Ambulatory Visit: Payer: Self-pay | Admitting: Orthopedic Surgery

## 2023-12-31 DIAGNOSIS — S82123A Displaced fracture of lateral condyle of unspecified tibia, initial encounter for closed fracture: Secondary | ICD-10-CM | POA: Insufficient documentation
